# Patient Record
Sex: Male | Born: 1985
Health system: Southern US, Community
[De-identification: ages and names within clinical notes are randomized; demographics above are authoritative.]

## PROBLEM LIST (undated history)

## (undated) DIAGNOSIS — E119 Type 2 diabetes mellitus without complications: Secondary | ICD-10-CM

## (undated) DIAGNOSIS — I10 Essential (primary) hypertension: Secondary | ICD-10-CM

## (undated) HISTORY — DX: Type 2 diabetes mellitus without complications: E11.9

## (undated) HISTORY — DX: Essential (primary) hypertension: I10

---

## 2015-01-25 ENCOUNTER — Ambulatory Visit (INDEPENDENT_AMBULATORY_CARE_PROVIDER_SITE_OTHER): Payer: 59 | Admitting: Family Medicine

## 2015-01-25 VITALS — BP 158/90 | HR 75 | Temp 98.5°F | Resp 16 | Ht 74.5 in | Wt 278.2 lb

## 2015-01-25 DIAGNOSIS — E119 Type 2 diabetes mellitus without complications: Secondary | ICD-10-CM | POA: Diagnosis not present

## 2015-01-25 DIAGNOSIS — I10 Essential (primary) hypertension: Secondary | ICD-10-CM

## 2015-01-25 DIAGNOSIS — Z2821 Immunization not carried out because of patient refusal: Secondary | ICD-10-CM | POA: Diagnosis not present

## 2015-01-25 LAB — LIPID PANEL
Cholesterol: 208 mg/dL — ABNORMAL HIGH (ref 125–200)
HDL: 34 mg/dL — ABNORMAL LOW (ref >=40)
LDL Cholesterol: 151 mg/dL — ABNORMAL HIGH (ref <130)
Total CHOL/HDL Ratio: 6.1 ratio — ABNORMAL HIGH (ref <=5.0)
Triglycerides: 113 mg/dL (ref <150)
VLDL: 23 mg/dL (ref <30)

## 2015-01-25 LAB — COMPLETE METABOLIC PANEL WITHOUT GFR
ALT: 14 U/L (ref 9–46)
AST: 14 U/L (ref 10–40)
BUN: 9 mg/dL (ref 7–25)
Chloride: 99 mmol/L (ref 98–110)
Creat: 0.82 mg/dL (ref 0.60–1.35)
Glucose, Bld: 175 mg/dL — ABNORMAL HIGH (ref 65–99)
Potassium: 4.4 mmol/L (ref 3.5–5.3)
Sodium: 137 mmol/L (ref 135–146)

## 2015-01-25 LAB — COMPLETE METABOLIC PANEL WITH GFR
Albumin: 4.9 g/dL (ref 3.6–5.1)
Alkaline Phosphatase: 56 U/L (ref 40–115)
CO2: 27 mmol/L (ref 20–31)
Calcium: 9.6 mg/dL (ref 8.6–10.3)
GFR, Est African American: >89 mL/min (ref >=60)
GFR, Est Non African American: >89 mL/min (ref >=60)
Total Bilirubin: 0.5 mg/dL (ref 0.2–1.2)
Total Protein: 8 g/dL (ref 6.1–8.1)

## 2015-01-25 LAB — MICROALBUMIN, URINE: Microalb, Ur: 1.2 mg/dL (ref <2.0)

## 2015-01-25 LAB — TSH: TSH: 0.558 u[IU]/mL (ref 0.350–4.500)

## 2015-01-25 LAB — POCT GLYCOSYLATED HEMOGLOBIN (HGB A1C): Hemoglobin A1C: 7.3

## 2015-01-25 MED ORDER — METFORMIN HCL ER 500 MG PO TB24: ORAL_TABLET | ORAL | Status: DC

## 2015-01-25 MED ORDER — LISINOPRIL 20 MG PO TABS: 20.0000 mg | ORAL_TABLET | Freq: Every day | ORAL | Status: DC

## 2015-01-25 NOTE — Progress Notes (Signed)
Chief Complaint:  Chief Complaint  Patient presents with  . Medication Refill    all    HPI: Drew Hernandez is a 29 y.o. male who reports to [facility] today complaining of   1. HTN-was on lisinopril then was changed to atenolol, did not have any SEs. No cough, etc  2. Diabetes-has been on metformin and also glipizide, has ahd some hypoglycemia, doe snot measure but knows his sxs Denies neuropathy, CP, SOB, hypoglycemia, palpitations, presyncope/syncope, polydipsia, polyuria, nausea, vomiting, abd pain He does have carpal tunnel like sxs since started working for [employer] Moved here from [place] to  to be closer to mom who is here by herself    Past Medical History  Diagnosis Date  . Diabetes mellitus without complication     dx with DM 2 age 37, he was on insulin in the past and lost weight, was over 400 lbs.   . Hypertension     age 16 due to obesity   History reviewed. No pertinent past surgical history. Social History   Social History  . Marital Status: Single    Spouse Name: N/A  . Number of Children: N/A  . Years of Education: N/A   Social History Main Topics  . Smoking status: Never Smoker   . Smokeless tobacco: None  . Alcohol Use: None  . Drug Use: None  . Sexual Activity: Not Asked   Other Topics Concern  . None   Social History Narrative  . None   Family History  Problem Relation Age of Onset  . Cancer Paternal Grandfather   . Hypertension Paternal Grandfather    No Known Allergies Prior to Admission medications   Medication Sig Start Date End Date Taking? Authorizing Provider  atenolol (TENORMIN) 25 MG tablet Take by mouth daily.   Yes Historical Provider, MD  glipiZIDE (GLUCOTROL) 5 MG tablet Take by mouth 3 (three) times daily with meals.   Yes Historical Provider, MD  metFORMIN (GLUCOPHAGE) 1000 MG tablet Take 1,000 mg by mouth 2 (two) times daily with a meal.   Yes Historical Provider, MD     ROS: The patient denies fevers, chills, night  sweats, unintentional weight loss, chest pain, palpitations, wheezing, dyspnea on exertion, nausea, vomiting, abdominal pain, dysuria, hematuria, melena, numbness, weakness, or tingling.   All other systems have been reviewed and were otherwise negative with the exception of those mentioned in the HPI and as above.    PHYSICAL EXAM: Filed Vitals:   01/25/15 1027  BP: 158/90  Pulse: 75  Temp: 98.5 F (36.9 C)  Resp: 16   Body mass index is 35.25 kg/(m^2).   General: Alert, no acute distress HEENT:  Normocephalic, atraumatic, oropharynx patent. EOMI, PERRLA Cardiovascular:  Regular rate and rhythm, no rubs murmurs or gallops.  No Carotid bruits, radial pulse intact. No pedal edema.  Respiratory: Clear to auscultation bilaterally.  No wheezes, rales, or rhonchi.  No cyanosis, no use of accessory musculature Abdominal: No organomegaly, abdomen is soft and non-tender, positive bowel sounds. No masses. Skin: No rashes. Neurologic: Facial musculature symmetric. Psychiatric: Patient acts appropriately throughout our interaction. Lymphatic: No cervical or submandibular lymphadenopathy Musculoskeletal: Gait intact. No edema, tenderness   LABS: Results for orders placed or performed in visit on 01/25/15  POCT glycosylated hemoglobin (Hb A1C)  Result Value Ref Range   Hemoglobin A1C 7.3      EKG/XRAY:   Primary read interpreted by Dr. Conley Rolls at [facility]   ASSESSMENT/PLAN: Encounter Diagnoses  Name Primary?  . Essential hypertension Yes  . Type 2 diabetes mellitus without complication   . Influenza vaccination declined    rx lisinopril 20 mg daily  Dicontinue atenolol if CMP and GFR is normal Change metrofmin 1000 mg bid to metofrmin 500 mg ER TID and tolerate the change to 500 mg in AM, lunch and also 1000 mg in PM Stop glipizide FU in 1 months Recommend : ADA diet, BP goal <140/90, daily foot exams, tobacco cessation if smoking, annual eye exam, annual flu vaccine, PNA vaccine if  age and time appropriate.   Gross sideeffects, risk and benefits, and alternatives of medications d/w patient. Patient is aware that all medications have potential sideeffects and we are unable to predict every sideeffect or drug-drug interaction that may occur.  Carla Rashad DO  01/25/2015 12:00 PM  01/26/15-LM about labs and he can start on the new regiment of meds since kidney fucntion ok. He will fu in 1 month

## 2015-01-25 NOTE — Patient Instructions (Addendum)
Diabetes and Standards of Medical Care Diabetes is complicated. You may find that your diabetes team includes a dietitian, nurse, diabetes educator, eye doctor, and more. To help everyone know what is going on and to help you get the care you deserve, the following schedule of care was developed to help keep you on track. Below are the tests, exams, vaccines, medicines, education, and plans you will need. HbA1c test This test shows how well you have controlled your glucose over the past 2-3 months. It is used to see if your diabetes management plan needs to be adjusted.   It is performed at least 2 times a year if you are meeting treatment goals.  It is performed 4 times a year if therapy has changed or if you are not meeting treatment goals. Blood pressure test  This test is performed at every routine medical visit. The goal is less than 140/90 mm Hg for most people, but 130/80 mm Hg in some cases. Ask your health care provider about your goal. Dental exam  Follow up with the dentist regularly. Eye exam  If you are diagnosed with type 1 diabetes as a child, get an exam upon reaching the age of 82 years or older and have had diabetes for 3-5 years. Yearly eye exams are recommended after that initial eye exam.  If you are diagnosed with type 1 diabetes as an adult, get an exam within 5 years of diagnosis and then yearly.  If you are diagnosed with type 2 diabetes, get an exam as soon as possible after the diagnosis and then yearly. Foot care exam  Visual foot exams are performed at every routine medical visit. The exams check for cuts, injuries, or other problems with the feet.  A comprehensive foot exam should be done yearly. This includes visual inspection as well as assessing foot pulses and testing for loss of sensation.  Check your feet nightly for cuts, injuries, or other problems with your feet. Tell your health care provider if anything is not healing. Kidney function test (urine  microalbumin)  This test is performed once a year.  Type 1 diabetes: The first test is performed 5 years after diagnosis.  Type 2 diabetes: The first test is performed at the time of diagnosis.  A serum creatinine and estimated glomerular filtration rate (eGFR) test is done once a year to assess the level of chronic kidney disease (CKD), if present. Lipid profile (cholesterol, HDL, LDL, triglycerides)  Performed every 5 years for most people.  The goal for LDL is less than 100 mg/dL. If you are at high risk, the goal is less than 70 mg/dL.  The goal for HDL is 40 mg/dL-50 mg/dL for men and 50 mg/dL-60 mg/dL for women. An HDL cholesterol of 60 mg/dL or higher gives some protection against heart disease.  The goal for triglycerides is less than 150 mg/dL. Influenza vaccine, pneumococcal vaccine, and hepatitis B vaccine  The influenza vaccine is recommended yearly.  It is recommended that people with diabetes who are over 30 years old get the pneumonia vaccine. In some cases, two separate shots may be given. Ask your health care provider if your pneumonia vaccination is up to date.  The hepatitis B vaccine is also recommended for adults with diabetes. Diabetes self-management education  Education is recommended at diagnosis and ongoing as needed. Treatment plan  Your treatment plan is reviewed at every medical visit. Document Released: 03/15/2009 Document Revised: 10/02/2013 Document Reviewed: 10/18/2012 [facility] Patient Information 2015 [place]. This information is not intended to replace advice given to you by your health care provider. Make sure you discuss any questions you have with your health care provider. Hypertension Hypertension is another name for high blood pressure. High blood pressure forces your heart to work harder to pump blood. A blood pressure reading has two numbers, which includes a higher number over a lower number (example: 110/72). HOME CARE   Have  your blood pressure rechecked by your doctor.  Only take medicine as told by your doctor. Follow the directions carefully. The medicine does not work as well if you skip doses. Skipping doses also puts you at risk for problems.  Do not smoke.  Monitor your blood pressure at home as told by your doctor. GET HELP IF:  You think you are having a reaction to the medicine you are taking.  You have repeat headaches or feel dizzy.  You have puffiness (swelling) in your ankles.  You have trouble with your vision. GET HELP RIGHT AWAY IF:   You get a very bad headache and are confused.  You feel weak, numb, or faint.  You get chest or belly (abdominal) pain.  You throw up (vomit).  You cannot breathe very well. MAKE SURE YOU:   Understand these instructions.  Will watch your condition.  Will get help right away if you are not doing well or get worse. Document Released: 11/04/2007 Document Revised: 05/23/2013 Document Reviewed: 03/10/2013 [employer] Patient Information 2015 [employer]. This information is not intended to replace advice given to you by your health care provider. Make sure you discuss any questions you have with your health care provider.  

## 2015-01-26 DIAGNOSIS — E119 Type 2 diabetes mellitus without complications: Secondary | ICD-10-CM | POA: Insufficient documentation

## 2015-01-26 DIAGNOSIS — I10 Essential (primary) hypertension: Secondary | ICD-10-CM | POA: Insufficient documentation

## 2015-02-01 ENCOUNTER — Encounter: Payer: Self-pay | Admitting: Family Medicine

## 2015-03-08 ENCOUNTER — Ambulatory Visit (INDEPENDENT_AMBULATORY_CARE_PROVIDER_SITE_OTHER): Payer: 59 | Admitting: Emergency Medicine

## 2015-03-08 VITALS — BP 132/84 | HR 70 | Temp 98.4°F | Resp 18 | Ht 74.5 in | Wt 274.0 lb

## 2015-03-08 DIAGNOSIS — I10 Essential (primary) hypertension: Secondary | ICD-10-CM

## 2015-03-08 DIAGNOSIS — E119 Type 2 diabetes mellitus without complications: Secondary | ICD-10-CM | POA: Diagnosis not present

## 2015-03-08 DIAGNOSIS — N521 Erectile dysfunction due to diseases classified elsewhere: Secondary | ICD-10-CM | POA: Diagnosis not present

## 2015-03-08 LAB — LIPID PANEL
Cholesterol: 199 mg/dL (ref 125–200)
HDL: 34 mg/dL — ABNORMAL LOW (ref >=40)
LDL CALC: 153 mg/dL — AB (ref <130)
TRIGLYCERIDES: 59 mg/dL (ref <150)
Total CHOL/HDL Ratio: 5.9 Ratio — ABNORMAL HIGH (ref <=5.0)
VLDL: 12 mg/dL (ref <30)

## 2015-03-08 LAB — COMPREHENSIVE METABOLIC PANEL
ALT: 13 U/L (ref 9–46)
AST: 15 U/L (ref 10–40)
Albumin: 4.7 g/dL (ref 3.6–5.1)
Alkaline Phosphatase: 50 U/L (ref 40–115)
BUN: 10 mg/dL (ref 7–25)
CHLORIDE: 101 mmol/L (ref 98–110)
CO2: 27 mmol/L (ref 20–31)
CREATININE: 0.75 mg/dL (ref 0.60–1.35)
Calcium: 9.6 mg/dL (ref 8.6–10.3)
GLUCOSE: 124 mg/dL — AB (ref 65–99)
Potassium: 4.8 mmol/L (ref 3.5–5.3)
SODIUM: 137 mmol/L (ref 135–146)
Total Bilirubin: 0.8 mg/dL (ref 0.2–1.2)
Total Protein: 7.7 g/dL (ref 6.1–8.1)

## 2015-03-08 LAB — HEMOGLOBIN A1C: Hgb A1c MFr Bld: 7.2 % — AB (ref 4.0–6.0)

## 2015-03-08 LAB — POCT GLYCOSYLATED HEMOGLOBIN (HGB A1C): HEMOGLOBIN A1C: 7.2

## 2015-03-08 LAB — GLUCOSE, POCT (MANUAL RESULT ENTRY): POC GLUCOSE: 126 mg/dL — AB (ref 70–99)

## 2015-03-08 MED ORDER — METFORMIN HCL ER 500 MG PO TB24: ORAL_TABLET | ORAL | Status: DC

## 2015-03-08 MED ORDER — SILDENAFIL CITRATE 100 MG PO TABS: 100.0000 mg | ORAL_TABLET | Freq: Every day | ORAL | Status: DC | PRN

## 2015-03-08 MED ORDER — PRAVASTATIN SODIUM 40 MG PO TABS: 40.0000 mg | ORAL_TABLET | Freq: Every day | ORAL | Status: DC

## 2015-03-08 MED ORDER — LISINOPRIL 20 MG PO TABS: 20.0000 mg | ORAL_TABLET | Freq: Every day | ORAL | Status: DC

## 2015-03-08 NOTE — Patient Instructions (Signed)

## 2015-03-08 NOTE — Progress Notes (Signed)
Subjective:  Patient ID: Drew Hernandez, male    DOB: 02/16/1986  Age: 29 y.o. MRN: [id number]  CC: Follow-up   HPI Drew Hernandez presents  for repeat of his lab work. His blood pressures come down nicely with the lisinopril he has no adverse effect of the lisinopril or the metformin. He doesn't check his sugar daily. Is concerned about taking the metformin 1 dose because of concerns about diarrhea.  History Drew Hernandez has a past medical history of Diabetes mellitus without complication (HCC) and Hypertension.   He has no past surgical history on file.   His  family history includes Cancer in his paternal grandfather; Hypertension in his paternal grandfather.  He   reports that he has never smoked. He does not have any smokeless tobacco history on file. His alcohol and drug histories are not on file.  Outpatient Prescriptions Prior to Visit  Medication Sig Dispense Refill  . lisinopril (PRINIVIL,ZESTRIL) 20 MG tablet Take 1 tablet (20 mg total) by mouth daily. 30 tablet 3  . metFORMIN (GLUCOPHAGE XR) 500 MG 24 hr tablet Take 1 tab PO TID, then if tolerate then take 1 tab with breakfast, 1 tab at lunch, and 2 tabs before bedtime 120 tablet 3   No facility-administered medications prior to visit.    Social History   Social History  . Marital Status: Single    Spouse Name: N/A  . Number of Children: N/A  . Years of Education: N/A   Social History Main Topics  . Smoking status: Never Smoker   . Smokeless tobacco: None  . Alcohol Use: None  . Drug Use: None  . Sexual Activity: Not Asked   Other Topics Concern  . None   Social History Narrative     Review of Systems  Objective:  BP 132/84 mmHg  Pulse 70  Temp(Src) 98.4 F (36.9 C) (Oral)  Resp 18  Ht 6' 2.5" (1.892 m)  Wt 274 lb (124.286 kg)  BMI 34.72 kg/m2  SpO2 98%  Physical Exam    Assessment & Plan:   Drew Hernandez was seen today for follow-up.  Diagnoses and all orders for this visit:  Diabetes mellitus  without complication (HCC) -     POCT glycosylated hemoglobin (Hb A1C) -     POCT glucose (manual entry) -     Comprehensive metabolic panel -     Lipid panel  Erectile dysfunction due to diseases classified elsewhere -     POCT glycosylated hemoglobin (Hb A1C) -     POCT glucose (manual entry) -     Comprehensive metabolic panel -     Lipid panel  Essential hypertension -     metFORMIN (GLUCOPHAGE XR) 500 MG 24 hr tablet; Take 1 tab PO TID, then if tolerate then take 1 tab with breakfast, 1 tab at lunch, and 2 tabs before bedtime -     lisinopril (PRINIVIL,ZESTRIL) 20 MG tablet; Take 1 tablet (20 mg total) by mouth daily.  Type 2 diabetes mellitus without complication, without long-term current use of insulin (HCC) -     metFORMIN (GLUCOPHAGE XR) 500 MG 24 hr tablet; Take 1 tab PO TID, then if tolerate then take 1 tab with breakfast, 1 tab at lunch, and 2 tabs before bedtime -     lisinopril (PRINIVIL,ZESTRIL) 20 MG tablet; Take 1 tablet (20 mg total) by mouth daily.  Other orders -     sildenafil (VIAGRA) 100 MG tablet; Take 1 tablet (100 mg total) by mouth  daily as needed for erectile dysfunction. -     pravastatin (PRAVACHOL) 40 MG tablet; Take 1 tablet (40 mg total) by mouth daily.   I am having Mr. Kataoka start on sildenafil and pravastatin. I am also having him maintain his metFORMIN and lisinopril.  Meds ordered this encounter  Medications  . sildenafil (VIAGRA) 100 MG tablet    Sig: Take 1 tablet (100 mg total) by mouth daily as needed for erectile dysfunction.    Dispense:  10 tablet    Refill:  11  . pravastatin (PRAVACHOL) 40 MG tablet    Sig: Take 1 tablet (40 mg total) by mouth daily.    Dispense:  90 tablet    Refill:  3  . metFORMIN (GLUCOPHAGE XR) 500 MG 24 hr tablet    Sig: Take 1 tab PO TID, then if tolerate then take 1 tab with breakfast, 1 tab at lunch, and 2 tabs before bedtime    Dispense:  120 tablet    Refill:  5  . lisinopril (PRINIVIL,ZESTRIL) 20  MG tablet    Sig: Take 1 tablet (20 mg total) by mouth daily.    Dispense:  30 tablet    Refill:  5   He was instructed to increase his metformin xr from one in the morning and 1 in the evening to 2 twice a day since he is concerned about diarrhea from taking them altogether and 1 dose. He'll follow-up as directed  Appropriate red flag conditions were discussed with the patient as well as actions that should be taken.  Patient expressed his understanding.  Follow-up: Return in about 6 months (around 09/06/2015).  Carmelina Dane, MD   . Results for orders placed or performed in visit on 03/08/15  POCT glycosylated hemoglobin (Hb A1C)  Result Value Ref Range   Hemoglobin A1C 7.2   POCT glucose (manual entry)  Result Value Ref Range   POC Glucose 126 (A) 70 - 99 mg/dl

## 2015-03-14 ENCOUNTER — Ambulatory Visit

## 2015-03-14 ENCOUNTER — Ambulatory Visit (INDEPENDENT_AMBULATORY_CARE_PROVIDER_SITE_OTHER): Admitting: Urgent Care

## 2015-03-14 VITALS — BP 130/84 | HR 77 | Temp 97.8°F | Resp 16 | Ht 73.75 in | Wt 272.0 lb

## 2015-03-14 DIAGNOSIS — M62830 Muscle spasm of back: Secondary | ICD-10-CM | POA: Diagnosis not present

## 2015-03-14 DIAGNOSIS — M5489 Other dorsalgia: Secondary | ICD-10-CM | POA: Diagnosis not present

## 2015-03-14 LAB — POCT URINALYSIS DIP (MANUAL ENTRY)
Bilirubin, UA: NEGATIVE
Blood, UA: NEGATIVE
Glucose, UA: NEGATIVE
LEUKOCYTES UA: NEGATIVE
NITRITE UA: NEGATIVE
PROTEIN UA: NEGATIVE
UROBILINOGEN UA: 1
pH, UA: 5.5

## 2015-03-14 MED ORDER — CYCLOBENZAPRINE HCL 10 MG PO TABS: 10.0000 mg | ORAL_TABLET | Freq: Three times a day (TID) | ORAL | Status: DC | PRN

## 2015-03-14 MED ORDER — MELOXICAM 15 MG PO TABS: 7.5000 mg | ORAL_TABLET | Freq: Every day | ORAL | Status: DC

## 2015-03-14 NOTE — Progress Notes (Addendum)
MRN: [id number] DOB: 1985/07/21  Subjective:   Drew Hernandez is a 29 y.o. male [employer] employee presenting for chief complaint of Back Pain  Reports ~1 month history of worsening back pain along his midline, radiates out laterally along his flank, worse with movement or sitting long periods of time. Pain has been dull, aching but yesterday progressed to sharp severe back pain. 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 His supervisor told him that he needed to come into clinic today. [facility] heating and cold packs that have not provided any relief. Denies fever, saddle paresthesia, trauma, weakness, falls, incontinence. 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 He also lifts boxes of varying weights from light to very heavy. He has had increasing difficulty completing all his tasks at work due to his back pain. Denies any other aggravating or relieving factors, no other questions or concerns.  Yamato's medications list, allergies, past medical history and past surgical history were reviewed and excluded from this note due to being a worker's comp case.  Objective:   Vitals: BP 130/84 mmHg  Pulse 77  Temp(Src) 97.8 F (36.6 C) (Oral)  Resp 16  Ht 6' 1.75" (1.873 m)  Wt 272 lb (123.378 kg)  BMI 35.17 kg/m2  SpO2 98%  Physical Exam  Constitutional: He is oriented to person, place, and time. He appears well-developed and well-nourished.  Cardiovascular: Normal rate.   Pulmonary/Chest: Effort normal.  Abdominal: Soft. Bowel sounds are normal. He exhibits no distension and no mass. There is no tenderness.  Musculoskeletal:       Thoracic back: He exhibits decreased range of motion (flexion and extension), tenderness (as depicted, worse on right) and spasm. He exhibits no swelling, no edema, no deformity, no laceration and no pain.       Lumbar back: He exhibits  decreased range of motion (flexion and extension). He exhibits no tenderness, no bony tenderness, no swelling, no deformity and no spasm.       Back:  Neurological: He is alert and oriented to person, place, and time.  Skin: Skin is warm and dry. No rash noted. No erythema. No pallor.   [facility] reading (PRIMARY) by  Dr. Milus GlazierLauenstein and PA-Lynton Crescenzo. Thoracic - mild scoliosis consistent with back spasms, no acute process. Lumbar - normal.  Dg Thoracic Spine 2 View  03/14/2015  CLINICAL DATA:  Pain. EXAM: THORACIC SPINE 2 VIEWS COMPARISON:  None. FINDINGS: Thoraco lumbar scoliosis. No acute bony abnormality. Normal mineralization. IMPRESSION: Thoracolumbar scoliosis.  No acute abnormality. Electronically Signed   By: Maisie Fushomas  Register   On: 03/14/2015 11:10   Dg Lumbar Spine Complete  03/14/2015  CLINICAL DATA:  Mid back pain EXAM: LUMBAR SPINE - COMPLETE 4+ VIEW COMPARISON:  None. FINDINGS: There is no evidence of lumbar spine fracture. Alignment is normal. Intervertebral disc spaces are maintained. IMPRESSION: No acute osseous injury of the lumbar spine. Electronically Signed   By: Elige KoHetal  Patel   On: 03/14/2015 11:10     Results for orders placed or performed in visit on 03/14/15 (from the past 24 hour(s))  POCT urinalysis dipstick     Status: Abnormal   Collection Time: 03/14/15 10:09 AM  Result Value Ref Range   Color, UA yellow yellow   Clarity, UA clear clear   Glucose, UA negative negative   Bilirubin, UA negative negative   Ketones, POC UA trace (5) (A)  negative   Spec Grav, UA >=1.030    Blood, UA negative negative   pH, UA 5.5    Protein Ur, POC negative negative   Urobilinogen, UA 1.0    Nitrite, UA Negative Negative   Leukocytes, UA Negative Negative   Assessment and Plan :   1. Spasm of back muscles 2. Midline back pain, unspecified location - Likely work-related due to repetitive motions, lifting variable weights. Advised light duty for 1 week, meloxicam for pain and  inflammation, Flexeril for muscle spasms. Work restrictions provided.  Wallis Bamberg, PA-C [facility] [phone] 03/14/2015 9:22 AM  History, physical, and x-ray reviewed. Assessment and plan seem reasonable.  Signed, Marshell Levan

## 2015-03-14 NOTE — Patient Instructions (Signed)

## 2015-03-21 ENCOUNTER — Ambulatory Visit (INDEPENDENT_AMBULATORY_CARE_PROVIDER_SITE_OTHER): Admitting: Physician Assistant

## 2015-03-21 VITALS — BP 128/80 | HR 87 | Temp 98.5°F | Resp 16 | Ht 75.0 in | Wt 272.0 lb

## 2015-03-21 DIAGNOSIS — M5489 Other dorsalgia: Secondary | ICD-10-CM | POA: Diagnosis not present

## 2015-03-21 DIAGNOSIS — M62830 Muscle spasm of back: Secondary | ICD-10-CM | POA: Diagnosis not present

## 2015-03-21 NOTE — Progress Notes (Addendum)
Drew Hernandez March 04, 1986 29 y.o.   Chief Complaint  Patient presents with  . Follow-up    back pain, x 1 week      Date of Injury: ~1 month and 7 days ago from the date of this note. Marland Kitchen.   History of Present Illness:  Presents for evaluation of work-related complaint. He is here for follow up of [facility]  Than HPI is as follows:   "Drew Hernandez is a 29 y.o. male [employer] employee presenting for chief complaint of Back Pain:  Reports ~1 month history of worsening back pain along his midline, radiates out laterally along his flank, worse with movement or sitting long periods of time. Pain has been dull, aching but yesterday progressed to sharp severe back pain. 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 His supervisor told him that he needed to come into clinic today. [facility] and cold packs that have not provided any relief. Denies fever, saddle paresthesia, trauma, weakness, falls, incontinence. 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 He also lifts boxes of varying weights from light to very heavy. He has had increasing difficulty completing all his tasks at work due to his back pain. Denies any other aggravating or relieving factors, no other questions or concerns."  Today he continues to complain of pain however reports that he feels 50% better.  He was asked to return to work only when he is full duty.  He is compliant with Meloxicam daily and Flexeril recommendations.  He feels that he needs more time. Denies incontinence, weakness and paresthesia.      Review of Systems  Constitutional: Negative for fever.  Respiratory: Negative for cough.   Cardiovascular: Negative for chest pain.  Genitourinary: Negative for dysuria, urgency and frequency.  Musculoskeletal: Positive for myalgias and back pain. Negative for joint  pain, falls and neck pain.  Skin: Negative for rash.  Neurological: Negative for dizziness and headaches.      No Known Allergies   Current medications reviewed and updated. Past medical history, family history, social history have been reviewed and updated.   Physical Exam  Constitutional: He is oriented to person, place, and time.  Cardiovascular: Normal rate.   Pulmonary/Chest: Effort normal.  Abdominal: He exhibits no distension.  Neurological: He is alert and oriented to person, place, and time. He has normal motor skills, normal sensation, normal strength, normal reflexes and intact cranial nerves. He displays no weakness. Gait normal.  Reflex Scores:      Patellar reflexes are 2+ on the right side and 2+ on the left side.      Achilles reflexes are 2+ on the right side and 2+ on the left side. Skin: Skin is warm and dry.  Psychiatric: Affect normal.  Vitals reviewed.   Filed Vitals:   03/21/15 1139  BP: 128/80  Pulse: 87  Temp: 98.5 F (36.9 C)  Resp: 16     Drew Hernandez was seen today for follow-up.  Diagnoses and all orders for this visit:  Spasm of back muscles: Patient improving.  No alarm features.  He is to RTC in five days, 03/26/15 for potential clearance without restrictions.   Midline back pain, unspecified location    Deliah BostonMichael Clark, MS, PA-C   12:28 PM, 03/25/2015

## 2015-03-26 ENCOUNTER — Ambulatory Visit (INDEPENDENT_AMBULATORY_CARE_PROVIDER_SITE_OTHER): Admitting: Internal Medicine

## 2015-03-26 VITALS — BP 124/82 | HR 83 | Temp 98.4°F | Resp 16 | Ht 75.0 in | Wt 275.0 lb

## 2015-03-26 DIAGNOSIS — S39012D Strain of muscle, fascia and tendon of lower back, subsequent encounter: Secondary | ICD-10-CM | POA: Diagnosis not present

## 2015-03-26 NOTE — Progress Notes (Signed)
Patient ID: Drew Hernandez, male   DOB: 07/04/1985, 29 y.o.   MRN: [id number]   03/26/2015 at 12:30 PM  Drew Hernandez / DOB: 07/04/1985 / MRN: [id number]  Problem list reviewed and updated by me where necessary.   SUBJECTIVE  Drew Hernandez is a 29 y.o. well appearing male presenting for the chief complaint of need return to work note. Back injury resolved and has full recovery..     He  has a past medical history of Diabetes mellitus without complication (HCC) and Hypertension.    Medications reviewed and updated by myself where necessary, and exist elsewhere in the encounter.   Drew Hernandez has No Known Allergies. He  reports that he has never smoked. He has never used smokeless tobacco. He reports that he drinks alcohol. He reports that he does not use illicit drugs. He  has no sexual activity history on file. The patient  has no past surgical history on file.  His family history includes Cancer in his paternal grandfather; Hypertension in his paternal grandfather.  Review of Systems  Constitutional: Negative for fever.  Respiratory: Negative for shortness of breath.   Cardiovascular: Negative for chest pain.  Gastrointestinal: Negative for nausea.  Musculoskeletal: Negative.   Skin: Negative for rash.  Neurological: Negative for dizziness and headaches.    OBJECTIVE  His  height is 6\' 3"  (1.905 m) and weight is 275 lb (124.739 kg). His oral temperature is 98.4 F (36.9 C). His blood pressure is 124/82 and his pulse is 83. His respiration is 16 and oxygen saturation is 99%.  The patient's body mass index is 34.37 kg/(m^2).  Physical Exam  Constitutional: He is oriented to person, place, and time. He appears well-developed and well-nourished. No distress.  HENT:  Head: Normocephalic.  Nose: Nose normal.  Eyes: Conjunctivae and EOM are normal.  Respiratory: Effort normal.  Musculoskeletal:       Lumbar back: Normal. He exhibits normal range of motion, no tenderness, no bony  tenderness, no swelling, no edema, no deformity, no laceration, no pain, no spasm and normal pulse.  Neurological: He is alert and oriented to person, place, and time. No cranial nerve deficit. He exhibits normal muscle tone. Coordination normal.  Psychiatric: He has a normal mood and affect.    No results found for this or any previous visit (from the past 24 hour(s)).  ASSESSMENT & PLAN  There are no diagnoses linked to this encounter.

## 2015-04-01 ENCOUNTER — Encounter: Payer: Self-pay | Admitting: Family Medicine

## 2015-04-05 ENCOUNTER — Other Ambulatory Visit: Payer: Self-pay

## 2015-04-05 DIAGNOSIS — I10 Essential (primary) hypertension: Secondary | ICD-10-CM

## 2015-04-05 DIAGNOSIS — E119 Type 2 diabetes mellitus without complications: Secondary | ICD-10-CM

## 2015-04-05 MED ORDER — PRAVASTATIN SODIUM 40 MG PO TABS: 40.0000 mg | ORAL_TABLET | Freq: Every day | ORAL | Status: DC

## 2015-04-05 MED ORDER — METFORMIN HCL ER 500 MG PO TB24: ORAL_TABLET | ORAL | Status: DC

## 2015-04-05 MED ORDER — LISINOPRIL 20 MG PO TABS
20.0000 mg | ORAL_TABLET | Freq: Every day | ORAL | Status: DC
Start: 2015-04-05 — End: 2015-04-09

## 2015-04-06 ENCOUNTER — Telehealth: Payer: Self-pay | Admitting: *Deleted

## 2015-04-06 NOTE — Telephone Encounter (Signed)
Need to know if medications are to go to [facility] or [facility].  We received a fax from [facility] stating they need clarification on three rx's metfomin, pravastatin, and lisinopril.  Not sure what [facility] need, rx's look fine.  [facility]  Lmom to see where he wants these filled at.  The only thing I could think of is [facility] may not have him the system

## 2015-04-09 ENCOUNTER — Telehealth: Payer: Self-pay

## 2015-04-09 ENCOUNTER — Other Ambulatory Visit: Payer: Self-pay | Admitting: *Deleted

## 2015-04-09 DIAGNOSIS — E119 Type 2 diabetes mellitus without complications: Secondary | ICD-10-CM

## 2015-04-09 DIAGNOSIS — I10 Essential (primary) hypertension: Secondary | ICD-10-CM

## 2015-04-09 MED ORDER — METFORMIN HCL ER 500 MG PO TB24: ORAL_TABLET | ORAL | Status: DC

## 2015-04-09 MED ORDER — PRAVASTATIN SODIUM 40 MG PO TABS: 40.0000 mg | ORAL_TABLET | Freq: Every day | ORAL | Status: DC

## 2015-04-09 MED ORDER — LISINOPRIL 20 MG PO TABS: 20.0000 mg | ORAL_TABLET | Freq: Every day | ORAL | Status: DC

## 2015-04-23 ENCOUNTER — Telehealth: Payer: Self-pay

## 2015-04-23 DIAGNOSIS — I10 Essential (primary) hypertension: Secondary | ICD-10-CM

## 2015-04-23 DIAGNOSIS — E119 Type 2 diabetes mellitus without complications: Secondary | ICD-10-CM

## 2015-04-23 MED ORDER — PRAVASTATIN SODIUM 40 MG PO TABS: 40.0000 mg | ORAL_TABLET | Freq: Every day | ORAL | Status: DC

## 2015-04-23 MED ORDER — LISINOPRIL 20 MG PO TABS: 20.0000 mg | ORAL_TABLET | Freq: Every day | ORAL | Status: DC

## 2015-04-23 MED ORDER — METFORMIN HCL ER 500 MG PO TB24: ORAL_TABLET | ORAL | Status: DC

## 2015-04-23 NOTE — Telephone Encounter (Signed)
Pt called and reported that [facility] is the correct mail order, but they say they did not receive the Rxs we sent on 11/8. Advised pt I will re-send. Done.

## 2015-05-17 ENCOUNTER — Ambulatory Visit (INDEPENDENT_AMBULATORY_CARE_PROVIDER_SITE_OTHER): Payer: 59 | Admitting: Family Medicine

## 2015-05-17 VITALS — BP 132/80 | HR 71 | Temp 99.0°F | Resp 18 | Ht 75.0 in | Wt 268.0 lb

## 2015-05-17 DIAGNOSIS — A09 Infectious gastroenteritis and colitis, unspecified: Secondary | ICD-10-CM | POA: Diagnosis not present

## 2015-05-17 DIAGNOSIS — R110 Nausea: Secondary | ICD-10-CM

## 2015-05-17 DIAGNOSIS — E118 Type 2 diabetes mellitus with unspecified complications: Secondary | ICD-10-CM | POA: Diagnosis not present

## 2015-05-17 DIAGNOSIS — R197 Diarrhea, unspecified: Secondary | ICD-10-CM

## 2015-05-17 LAB — COMPREHENSIVE METABOLIC PANEL
ALBUMIN: 4.4 g/dL (ref 3.6–5.1)
ALT: 15 U/L (ref 9–46)
AST: 16 U/L (ref 10–40)
Alkaline Phosphatase: 53 U/L (ref 40–115)
BILIRUBIN TOTAL: 0.5 mg/dL (ref 0.2–1.2)
BUN: 12 mg/dL (ref 7–25)
CALCIUM: 9.5 mg/dL (ref 8.6–10.3)
CHLORIDE: 102 mmol/L (ref 98–110)
CO2: 28 mmol/L (ref 20–31)
CREATININE: 0.78 mg/dL (ref 0.60–1.35)
Glucose, Bld: 126 mg/dL — ABNORMAL HIGH (ref 65–99)
Potassium: 5.1 mmol/L (ref 3.5–5.3)
SODIUM: 137 mmol/L (ref 135–146)
TOTAL PROTEIN: 7.7 g/dL (ref 6.1–8.1)

## 2015-05-17 LAB — POCT CBC
GRANULOCYTE PERCENT: 72.9 % (ref 37–80)
HCT, POC: 41.3 % — AB (ref 43.5–53.7)
Hemoglobin: 13.8 g/dL — AB (ref 14.1–18.1)
Lymph, poc: 1.6 (ref 0.6–3.4)
MCH: 27.5 pg (ref 27–31.2)
MCHC: 33.4 g/dL (ref 31.8–35.4)
MCV: 82.4 fL (ref 80–97)
MID (CBC): 0.4 (ref 0–0.9)
MPV: 7.5 fL (ref 0–99.8)
PLATELET COUNT, POC: 193 10*3/uL (ref 142–424)
POC Granulocyte: 5.2 (ref 2–6.9)
POC LYMPH %: 21.9 % (ref 10–50)
POC MID %: 5.2 % (ref 0–12)
RBC: 5.01 M/uL (ref 4.69–6.13)
RDW, POC: 11.9 %
WBC: 7.2 10*3/uL (ref 4.6–10.2)

## 2015-05-17 LAB — GLUCOSE, POCT (MANUAL RESULT ENTRY): POC Glucose: 144 mg/dl — AB (ref 70–99)

## 2015-05-17 MED ORDER — ONDANSETRON HCL 8 MG PO TABS: 8.0000 mg | ORAL_TABLET | Freq: Three times a day (TID) | ORAL | Status: DC | PRN

## 2015-05-17 NOTE — Patient Instructions (Signed)
Rest and drink plenty of fluids, and eat a bland diet.  Let us know if you do not continue to feel better!  I will be in touch with the rest of your labs.  If needed you can use the zofran as needed for nausea

## 2015-05-17 NOTE — Progress Notes (Signed)
[facility] [address], [place] [address] [phone]  Date:  05/17/2015   Name:  Drew LyonsRicky Hernandez   DOB:  [birth year]   MRN:  [id number]  PCP:  No PCP Per Patient    Chief Complaint: Diarrhea   History of Present Illness:  Drew CliffRicky Hernandez is a 29 y.o. very pleasant male patient who presents with the following:  History of obesity, DM2, HTN, dyslipidemia.  He has lost a lot of weight since dx of DM and this is doing well Here today with complaint of illness- he first got sick with a cold last week.  He feels like this is getting better but he is still hoarse. 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 Yesterday he had diarrhea and has felt nauseated.  Diarrhea is resolved today. He is not vomiting at all.  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  No belly pain.  No blood in his stools  He has felt dizzy recently.    Most recent A1c as below Lab Results  Component Value Date   HGBA1C 7.2 03/08/2015     Patient Active Problem List   Diagnosis Date Noted  . Spasm of back muscles 03/14/2015  . Essential hypertension 01/26/2015  . Type 2 diabetes mellitus without complication (HCC) 01/26/2015    Past Medical History  Diagnosis Date  . Diabetes mellitus without complication (HCC)     dx with DM 2 age 89, he was on insulin in the past and lost weight, was over 400 lbs.   . Hypertension     age 29 due to obesity    History reviewed. No pertinent past surgical history.  Social History  Substance Use Topics  . Smoking status: Never Smoker   . Smokeless tobacco: Never Used  . Alcohol Use: 0.0 oz/week    0 Standard drinks or equivalent per week    Family History  Problem Relation Age of Onset  . Cancer Paternal Grandfather   . Hypertension Paternal Grandfather     No Known Allergies  Medication list has been  reviewed and updated.  Current Outpatient Prescriptions on File Prior to Visit  Medication Sig Dispense Refill  . cyclobenzaprine (FLEXERIL) 10 MG tablet Take 1 tablet (10 mg total) by mouth 3 (three) times daily as needed for muscle spasms. 30 tablet 0  . lisinopril (PRINIVIL,ZESTRIL) 20 MG tablet Take 1 tablet (20 mg total) by mouth daily. 90 tablet 1  . meloxicam (MOBIC) 15 MG tablet Take 0.5-1 tablets (7.5-15 mg total) by mouth daily. 30 tablet 0  . metFORMIN (GLUCOPHAGE XR) 500 MG 24 hr tablet Take 1 tab PO TID, then if tolerate take 1 tab at breakfast, 1 tab at lunch,  2 tabs at bedtime. 360 tablet 1  . pravastatin (PRAVACHOL) 40 MG tablet Take 1 tablet (40 mg total) by mouth daily. 90 tablet 3  . sildenafil (VIAGRA) 100 MG tablet Take 1 tablet (100 mg total) by mouth daily as needed for erectile dysfunction. 10 tablet 11   No current facility-administered medications on file prior to visit.    Review of Systems:  As per HPI- otherwise negative.   Physical Examination: Filed Vitals:   05/17/15 1049  BP: 132/80  Pulse: 71  Temp: 99 F (37.2 C)  Resp: 18  Filed Vitals:   05/17/15 1049  Height:  (1.905 m)  Weight: 268 lb (121.564 kg)   Body mass index is 33.5 kg/(m^2). Ideal Body Weight: Weight in (lb) to have BMI = 25: 199.6  GEN: WDWN, NAD, Non-toxic, A & O x 3 HEENT: Atraumatic, Normocephalic. Neck supple. No masses, No LAD. Ears and Nose: No external deformity. CV: RRR, No M/G/R. No JVD. No thrill. No extra heart sounds. PULM: CTA B, no wheezes, crackles, rhonchi. No retractions. No resp. distress. No accessory muscle use. ABD: S, NT, ND, +BS. No rebound. No HSM. EXTR: No c/c/e NEURO Normal gait.  PSYCH: Normally interactive. Conversant. Not depressed or anxious appearing.  Calm demeanor.   Results for orders placed or performed in visit on 05/17/15  POCT CBC  Result Value Ref Range   WBC 7.2 4.6 - 10.2 K/uL   Lymph, poc 1.6 0.6 - 3.4   POC LYMPH  PERCENT 21.9 10 - 50 %L   MID (cbc) 0.4 0 - 0.9   POC MID % 5.2 0 - 12 %M   POC Granulocyte 5.2 2 - 6.9   Granulocyte percent 72.9 37 - 80 %G   RBC 5.01 4.69 - 6.13 M/uL   Hemoglobin 13.8 (A) 14.1 - 18.1 g/dL   HCT, POC 16.1 (A) 09.6 - 53.7 %   MCV 82.4 80 - 97 fL   MCH, POC 27.5 27 - 31.2 pg   MCHC 33.4 31.8 - 35.4 g/dL   RDW, POC 04.5 %   Platelet Count, POC 193 142 - 424 K/uL   MPV 7.5 0 - 99.8 fL  POCT glucose (manual entry)  Result Value Ref Range   POC Glucose 144 (A) 70 - 99 mg/dl     Assessment and Plan: Diarrhea of presumed infectious origin - Plan: POCT CBC, Comprehensive metabolic panel  Controlled type 2 diabetes mellitus with complication, without long-term current use of insulin (HCC) - Plan: POCT glucose (manual entry), Hemoglobin A1c  Nausea without vomiting - Plan: ondansetron (ZOFRAN) 8 MG tablet  Here today with recent illness- he had a cold, then diarrhea.  Diarrhea is now improved.  He would like to have a couple of days off work to rest and recover which is reasonable.  Gave him a note for work and rx for zofran Await other labs and will follow-up with him  Signed Abbe Amsterdam, MD

## 2015-05-18 ENCOUNTER — Encounter: Payer: Self-pay | Admitting: Family Medicine

## 2015-05-18 LAB — HEMOGLOBIN A1C
HEMOGLOBIN A1C: 6 % — AB (ref <5.7)
MEAN PLASMA GLUCOSE: 126 mg/dL — AB (ref <117)

## 2015-06-20 ENCOUNTER — Other Ambulatory Visit: Payer: Self-pay

## 2015-06-20 MED ORDER — SILDENAFIL CITRATE 100 MG PO TABS: 100.0000 mg | ORAL_TABLET | Freq: Every day | ORAL | Status: DC | PRN

## 2015-06-20 NOTE — Telephone Encounter (Signed)
Got req for viagra RFs to be sent to [facility]. I re-sent the remaining RFs from [facility] in [facility] day supplies to [facility].

## 2015-09-18 ENCOUNTER — Ambulatory Visit (INDEPENDENT_AMBULATORY_CARE_PROVIDER_SITE_OTHER): Payer: 59 | Admitting: Family Medicine

## 2015-09-18 VITALS — BP 122/76 | HR 74 | Temp 97.6°F | Resp 14 | Ht 75.0 in | Wt 277.0 lb

## 2015-09-18 DIAGNOSIS — G44209 Tension-type headache, unspecified, not intractable: Secondary | ICD-10-CM

## 2015-09-18 DIAGNOSIS — B349 Viral infection, unspecified: Secondary | ICD-10-CM

## 2015-09-18 DIAGNOSIS — R6883 Chills (without fever): Secondary | ICD-10-CM

## 2015-09-18 LAB — POCT INFLUENZA A/B
INFLUENZA B, POC: NEGATIVE
Influenza A, POC: NEGATIVE

## 2015-09-18 NOTE — Patient Instructions (Addendum)
  Great to meet you!  Please come back if your sym,ptoms worsen or do not get better as expected.

## 2015-09-18 NOTE — Progress Notes (Signed)
   HPI  Patient presents today with flu like illness  He describes 2 days of headache, fatigue, malaise, and body aches.   He denies SOB, chest pain, cough, and n/v/d.   He had chills throughout the night last night.   He is tolerating food and fluid like normal.   He just came [place] the beach and does not know of any sick contacts.   He has well controlled DM2  Describes HA as frontal and occipital, worse with standing, and associated with malaise and fatigue.  It is helped by aleve  PMH: Smoking status noted ROS: Per HPI  Objective: BP 122/76 mmHg  Pulse 74  Temp(Src) 97.6 F (36.4 C) (Oral)  Resp 14  Ht 6\' 3"  (1.905 m)  Wt 277 lb (125.646 kg)  BMI 34.62 kg/m2  SpO2 98% Gen: NAD, alert, cooperative with exam HEENT: NCAT, nares with swolllen turbinates, oropharynx clear  CV: RRR, good S1/S2, no murmur Resp: CTABL, no wheezes, non-labored Ext: No edema, warm Neuro: Alert and oriented, No gross deficits  Assessment and plan:  # Viral illness HA and malaise consistent with viral illness Supportive care, Flu negative 3 days out of work RTC with any worsening or prolonged symps.      Orders Placed This Encounter  Procedures  . POCT Influenza A/B    Kevin FentonSamuel Bradshaw, MD 9:59 AM

## 2015-11-05 ENCOUNTER — Other Ambulatory Visit: Payer: Self-pay

## 2015-11-05 DIAGNOSIS — E119 Type 2 diabetes mellitus without complications: Secondary | ICD-10-CM

## 2015-11-05 DIAGNOSIS — I10 Essential (primary) hypertension: Secondary | ICD-10-CM

## 2015-11-05 MED ORDER — METFORMIN HCL ER 500 MG PO TB24: ORAL_TABLET | ORAL | Status: DC

## 2015-11-05 MED ORDER — LISINOPRIL 20 MG PO TABS: 20.0000 mg | ORAL_TABLET | Freq: Every day | ORAL | Status: DC

## 2016-02-24 ENCOUNTER — Telehealth: Payer: Self-pay

## 2016-02-24 NOTE — Telephone Encounter (Signed)
Patient is calling because [facility]. They would like to faxed to [phone] Attn: Lawson FiscalLori

## 2016-03-07 IMAGING — CR DG THORACIC SPINE 2V
3 series · 3 of 3 positions shown · non-contrast
Comparison: None.

CLINICAL DATA: Pain.

EXAM:
THORACIC SPINE 2 VIEWS

[AP]
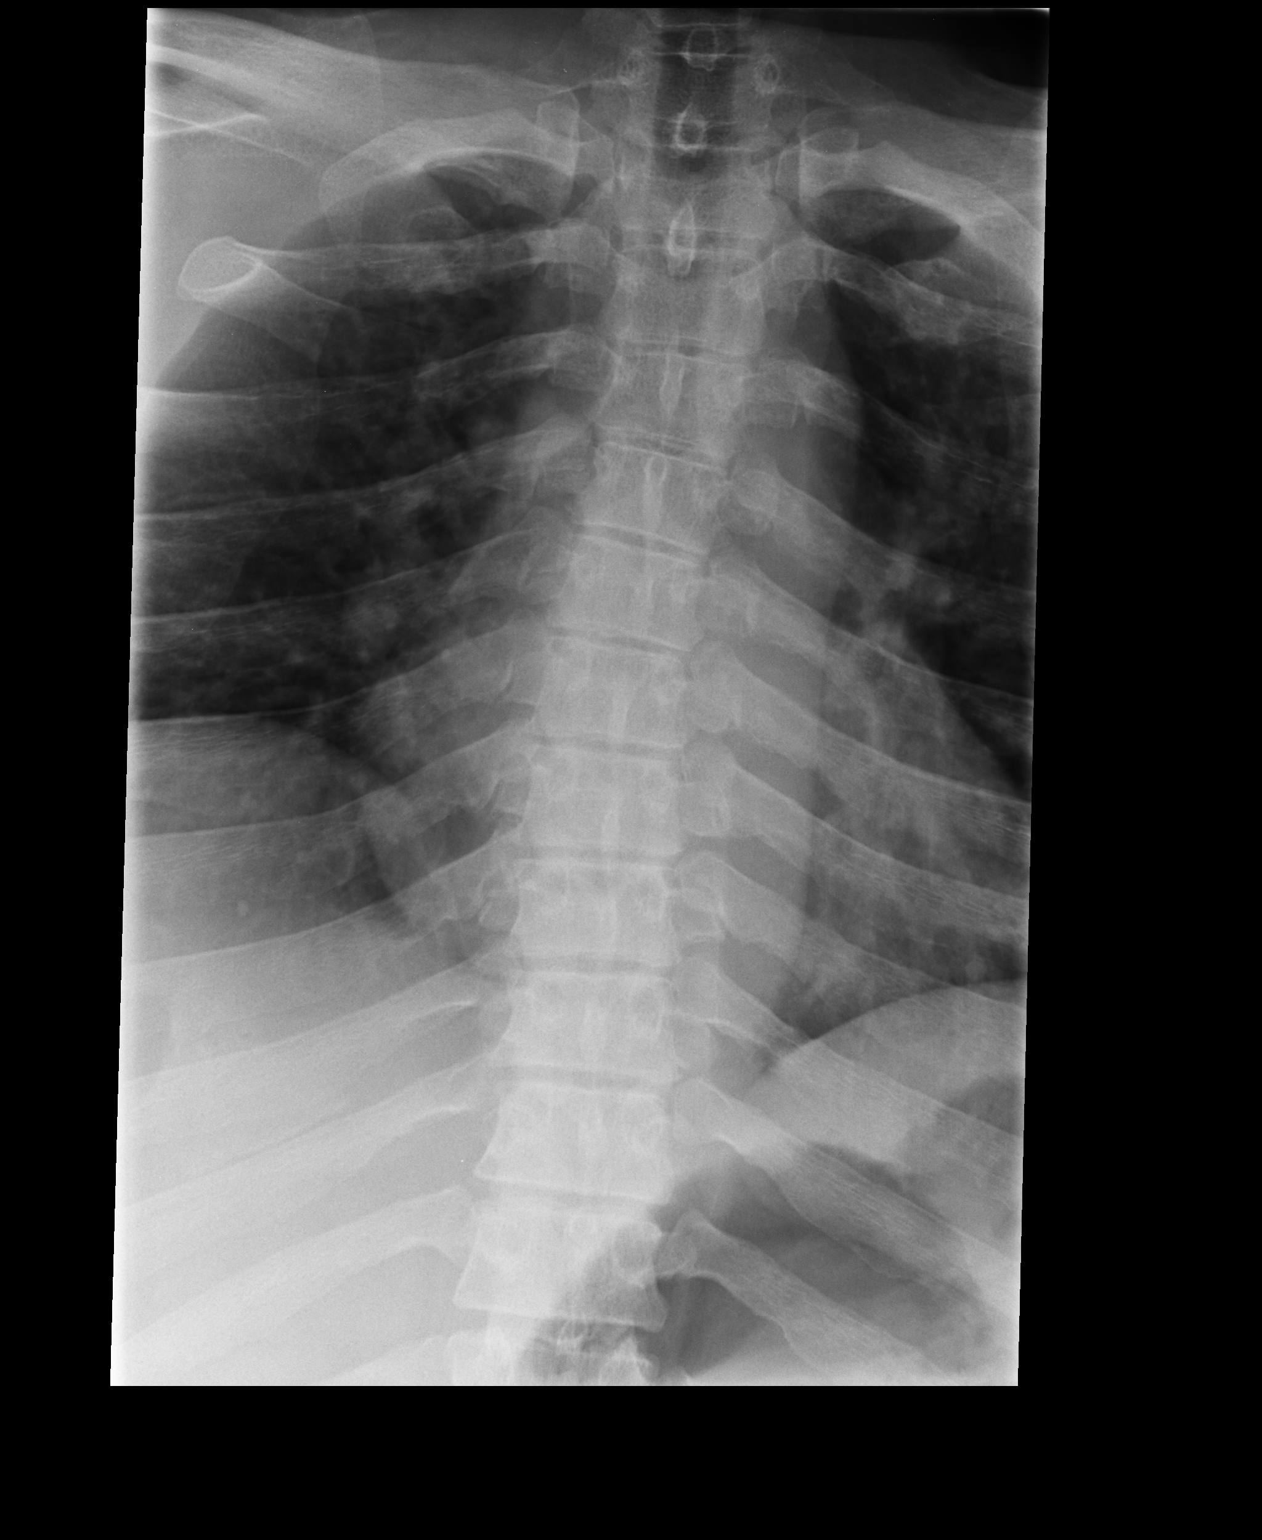

[lateral]
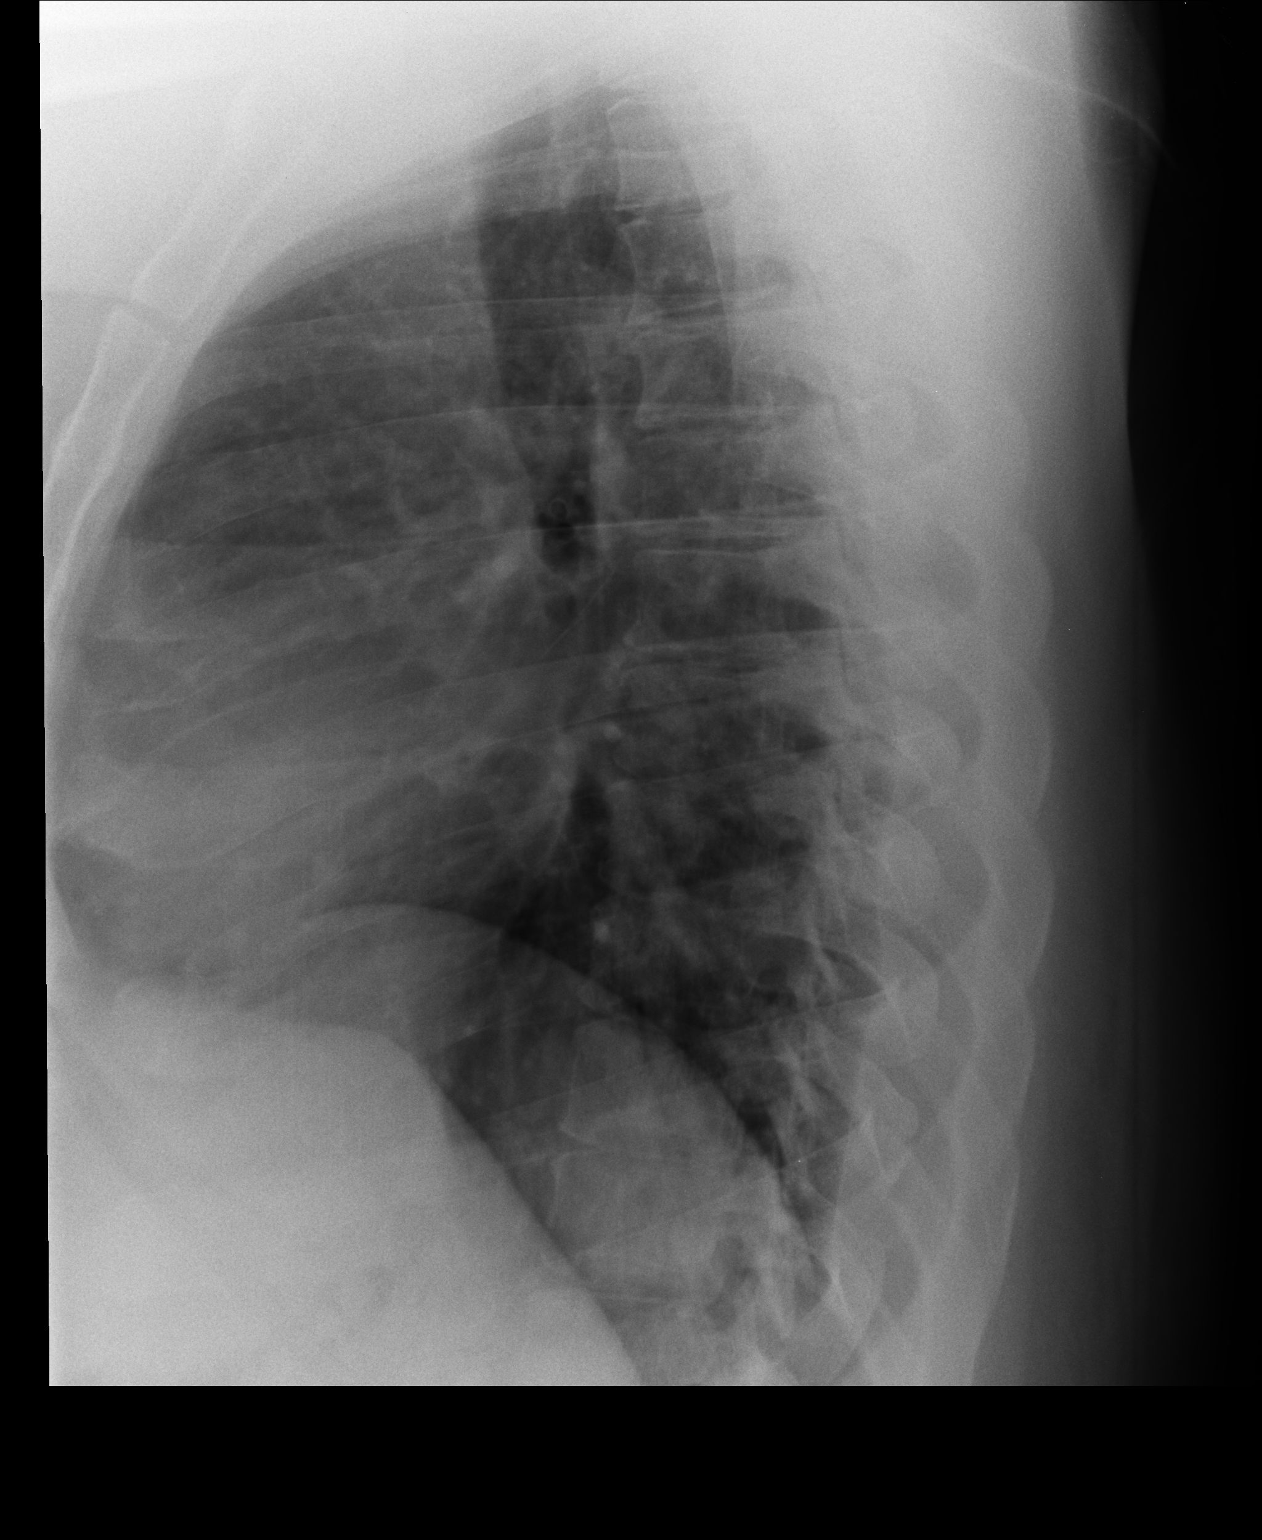

[swimmers]
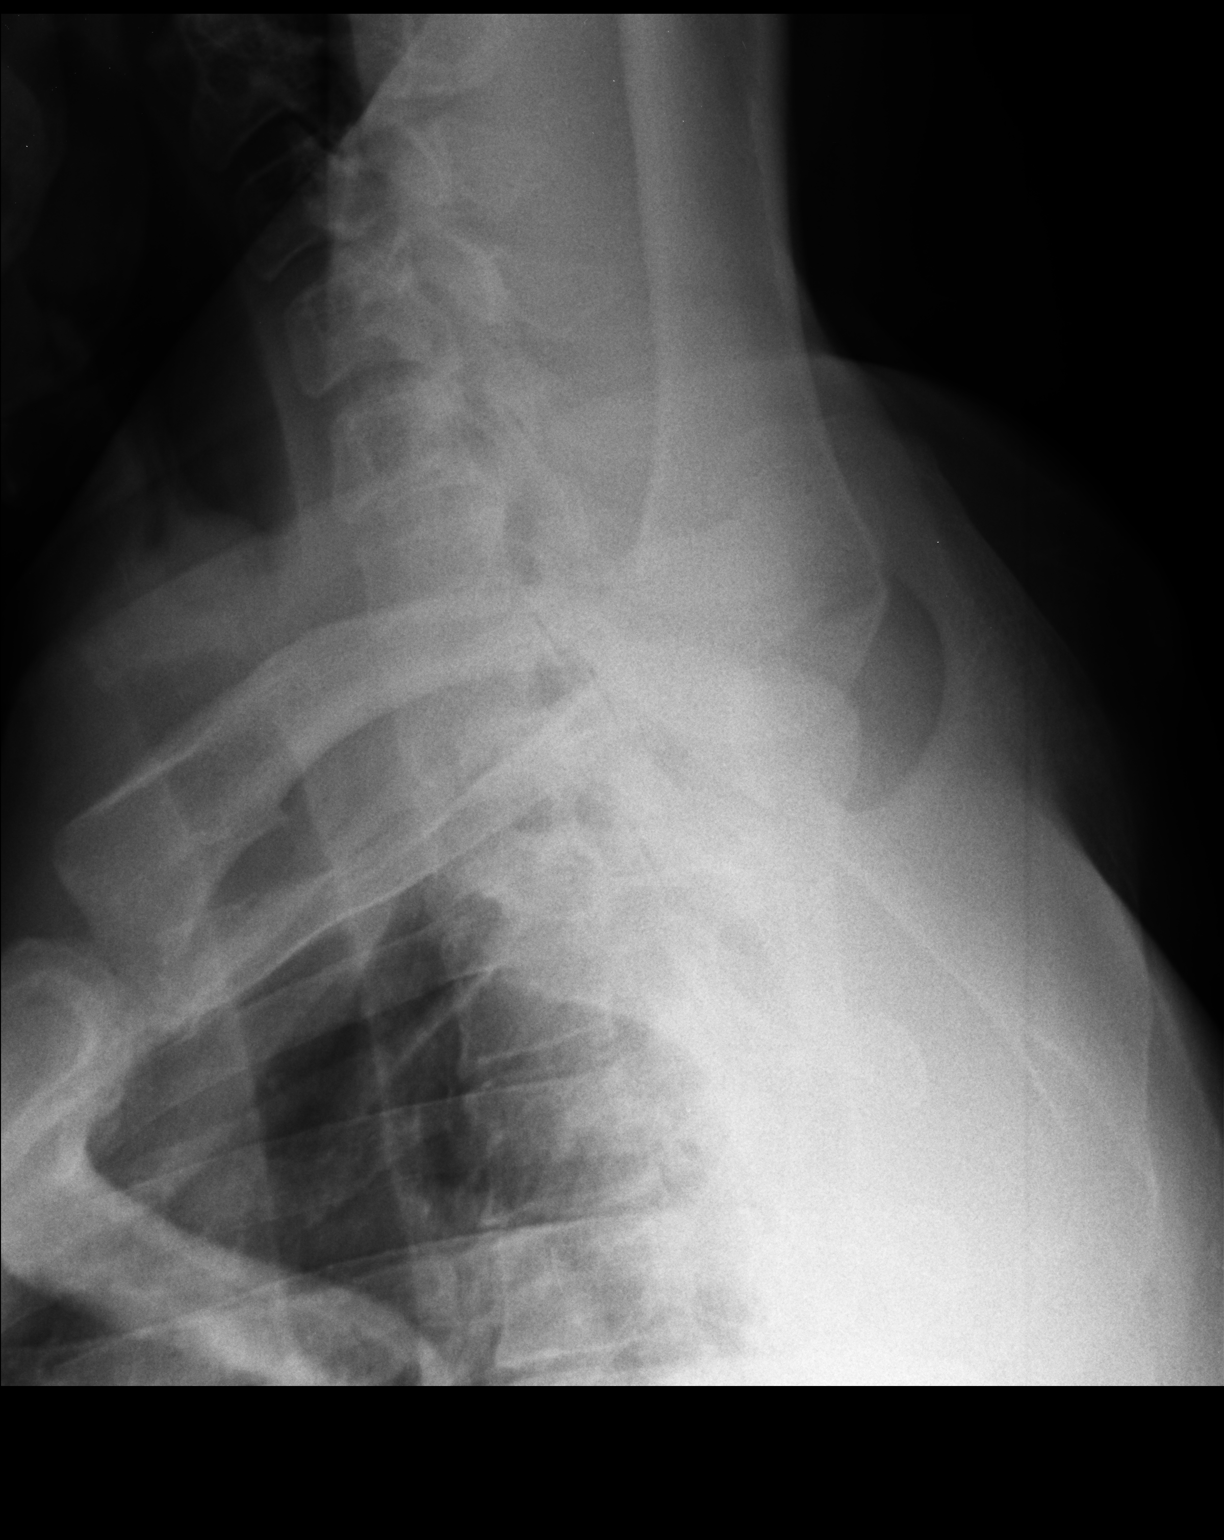

[3 of 3 positions shown; findings below may reference images not displayed]

FINDINGS: Thoraco lumbar scoliosis. No acute bony abnormality. Normal
mineralization.
IMPRESSION: Thoracolumbar scoliosis.  No acute abnormality.

## 2016-06-19 ENCOUNTER — Ambulatory Visit (INDEPENDENT_AMBULATORY_CARE_PROVIDER_SITE_OTHER): Payer: BC Managed Care – PPO | Admitting: Family Medicine

## 2016-06-19 VITALS — BP 130/90 | HR 86 | Temp 98.1°F | Resp 16 | Ht 74.75 in | Wt 284.8 lb

## 2016-06-19 DIAGNOSIS — N521 Erectile dysfunction due to diseases classified elsewhere: Secondary | ICD-10-CM | POA: Diagnosis not present

## 2016-06-19 DIAGNOSIS — E119 Type 2 diabetes mellitus without complications: Secondary | ICD-10-CM

## 2016-06-19 DIAGNOSIS — Z6835 Body mass index (BMI) 35.0-35.9, adult: Secondary | ICD-10-CM

## 2016-06-19 DIAGNOSIS — Z23 Encounter for immunization: Secondary | ICD-10-CM | POA: Diagnosis not present

## 2016-06-19 DIAGNOSIS — I10 Essential (primary) hypertension: Secondary | ICD-10-CM

## 2016-06-19 DIAGNOSIS — E6609 Other obesity due to excess calories: Secondary | ICD-10-CM

## 2016-06-19 DIAGNOSIS — IMO0001 Reserved for inherently not codable concepts without codable children: Secondary | ICD-10-CM

## 2016-06-19 DIAGNOSIS — E785 Hyperlipidemia, unspecified: Secondary | ICD-10-CM | POA: Diagnosis not present

## 2016-06-19 DIAGNOSIS — E1169 Type 2 diabetes mellitus with other specified complication: Secondary | ICD-10-CM | POA: Diagnosis not present

## 2016-06-19 MED ORDER — SILDENAFIL CITRATE 100 MG PO TABS: 100.0000 mg | ORAL_TABLET | Freq: Every day | ORAL | 6 refills | Status: DC | PRN

## 2016-06-19 MED ORDER — LISINOPRIL 20 MG PO TABS: 20.0000 mg | ORAL_TABLET | Freq: Every day | ORAL | 3 refills | Status: DC

## 2016-06-19 MED ORDER — PRAVASTATIN SODIUM 40 MG PO TABS: 40.0000 mg | ORAL_TABLET | Freq: Every day | ORAL | 3 refills | Status: DC

## 2016-06-19 MED ORDER — METFORMIN HCL ER 500 MG PO TB24: ORAL_TABLET | ORAL | 0 refills | Status: DC

## 2016-06-19 NOTE — Progress Notes (Addendum)
Chief Complaint  Patient presents with  . Medication Refill    Viagra 100 mg, Metformin 500mg , Lisinopril 20 mg, Pravastatin 40 mg    HPI  Hypertension: Patient here for follow-up of elevated blood pressure. He is not exercising and is adherent to low salt diet.  Blood pressure is well controlled at home. Cardiac symptoms none. Patient denies chest pain, dyspnea, exertional chest pressure/discomfort and lower extremity edema.  Cardiovascular risk factors: diabetes mellitus, hypertension, male gender and obesity (BMI >= 30 kg/m2). Use of agents associated with hypertension: none. History of target organ damage: none. BP Readings from Last 3 Encounters:  06/19/16 130/90  09/18/15 122/76  05/17/15 132/80   Body mass index is 35.84 kg/m.  Obesity Patient reports that he plans to start a weight loss program.  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 He does not take steroids. He denies any chest pains, palpitations or shortness of breath.  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  He has been without his diabetes medications for at least 4 months.  He states that he was previously compliant with metformin which he took 500mg  XR with breakfast and 1000mg  XR with dinner.  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  He states that he would check his sugars and his levels were 140 fasting.   Symptoms: none. Symptoms have been well-controlled. Patient denies foot ulcerations, nausea, paresthesia of the feet, polydipsia, polyuria, visual disturbances and vomitting.  Evaluation to date has been included: fasting blood sugar and  hemoglobin A1C.  Home sugars: patient does not check sugars.   Dyslipidemia: Patient presents for evaluation of lipids.  Compliance with treatment thus far has been poor.  He lost his insurance and has been out of all his medications. A repeat fasting lipid profile was done.  The patient does not use medications that may worsen dyslipidemias (corticosteroids, progestins, anabolic steroids, diuretics, beta-blockers, amiodarone, cyclosporine, olanzapine). The patient exercises never.  The patient is not known to have coexisting coronary artery disease.   Cardiac Risk Factors Age > 45-male, > 55-male:  NO  Smoking:   NO  Sig. family hx of CHD*:  NO  Hypertension:   YES  +1  Diabetes:   YES  +1  HDL < 35:   YES  +1  HDL > 59:   NO  Total: 3  *- Sig. family h/o CHD per NCEP = MI or sudden death at <55yo in  father or other 1st-degree male relative, or <65yo in mother or  other 1st-degree male relative   Past Medical History:  Diagnosis Date  . Diabetes mellitus without complication (HCC)    dx with DM 2 age 31, he was on insulin in the past and lost weight, was over 400 lbs.   . Hypertension    age 34 due to obesity   Erectile Dysfunction Pt takes 1/2 tablet to one tablet of viagra  Denies side effects of viagra Reports that he has required the medication due to his diabetes He typically gets 3 pills at a  time from the pharmacy due to cost  Current Outpatient Prescriptions  Medication Sig Dispense Refill  . lisinopril (PRINIVIL,ZESTRIL) 20 MG tablet Take 1 tablet (20 mg total) by mouth daily. 90 tablet 3  . metFORMIN (GLUCOPHAGE XR) 500 MG 24 hr tablet Take 1 tab PO TID, then if tolerate take 1 tab at breakfast,  2 tabs at bedtime. 270 tablet 0  . pravastatin (PRAVACHOL) 40 MG tablet Take 1 tablet (40 mg total) by mouth daily. 90 tablet 3  . sildenafil (VIAGRA) 100 MG tablet Take 1 tablet (100 mg total) by mouth daily as needed for erectile dysfunction. 3 tablet 6  .  cyclobenzaprine (FLEXERIL) 10 MG tablet Take 1 tablet (10 mg total) by mouth 3 (three) times daily as needed for muscle spasms. (Patient not taking: Reported on 09/18/2015) 30 tablet 0  . ondansetron (ZOFRAN) 8 MG tablet Take 1 tablet (8 mg total) by mouth every 8 (eight) hours as needed for nausea or vomiting. (Patient not taking: Reported on 09/18/2015) 15 tablet 0   No current facility-administered medications for this visit.     Allergies: No Known Allergies  No past surgical history on file. Family History  Problem Relation Age of Onset  . Cancer Paternal Grandfather   . Hypertension Paternal Grandfather      Social History   Social History  . Marital status: Single    Spouse name: N/A  . Number of children: N/A  . Years of education: N/A   Social History Main Topics  . Smoking status: Never Smoker  . Smokeless tobacco: Never Used  . Alcohol use 0.0 oz/week  . Drug use: No  . Sexual activity: Not Asked   Other Topics Concern  . None   Social History Narrative  . None    Review of Systems  Constitutional: Negative for chills, fever and weight loss.  HENT: Negative for ear discharge, ear pain, hearing loss and tinnitus.   Eyes: Negative for blurred vision, double vision and photophobia.  Respiratory: Negative for cough and wheezing.   Cardiovascular: Negative for chest pain, palpitations, orthopnea, claudication and leg swelling.  Gastrointestinal: Negative for abdominal pain, nausea and vomiting.  Genitourinary: Negative for dysuria, frequency, hematuria and urgency.  Skin: Negative for itching and rash.  Neurological: Negative for dizziness, tingling, tremors, sensory change, speech change, focal weakness and headaches.  Endo/Heme/Allergies: Negative for environmental allergies and polydipsia.  Psychiatric/Behavioral: Negative for depression. The patient is not nervous/anxious.     Objective: Vitals:   06/19/16 1319  BP: 130/90  Pulse: 86  Resp: 16  Temp:  98.1 F (36.7 C)  TempSrc: Oral  SpO2: 96%  Weight: 284 lb 12.8 oz (129.2 kg)  Height: 6' 2.75" (1.899 m)   Wt Readings from Last 3 Encounters:  06/19/16 284 lb 12.8 oz (129.2 kg)  09/18/15 277 lb (125.6 kg)  05/17/15 268 lb (121.6 kg)    Physical Exam  Constitutional: He is oriented to person, place, and time. He appears well-developed and well-nourished.  HENT:  Head: Normocephalic and atraumatic.  Right Ear: External ear normal.  Left Ear: External ear normal.  Mouth/Throat: Oropharynx is clear and moist.  Eyes: Conjunctivae and EOM are normal.  Neck: Normal range of motion. Neck supple.  Cardiovascular: Normal rate, regular rhythm and normal heart sounds.   No murmur heard. Pulmonary/Chest: Effort normal and breath sounds normal. No respiratory distress. He has no wheezes.  Abdominal: Soft. Bowel sounds are normal. He exhibits no distension. There is no tenderness.  Musculoskeletal: Normal range of motion. He exhibits no edema.  Neurological: He is alert and oriented to person, place, and time.  Skin: Skin is warm. Capillary refill takes less than 2 seconds. Rash noted. No erythema.  Psychiatric: He has a normal mood and affect. His behavior is normal. Judgment and thought content normal.    Assessment and Plan Drew Hernandez was seen today for medication refill.  Diagnoses and all orders for this visit:  Essential hypertension -     Comprehensive metabolic panel -     Lipid panel -     CBC with Differential/Platelet -     Cancel: Microalbumin, urine -     metFORMIN (GLUCOPHAGE XR) 500 MG 24 hr tablet; Take 1 tab PO TID, then if tolerate take 1 tab at breakfast,  2 tabs at bedtime. -     lisinopril (PRINIVIL,ZESTRIL) 20 MG tablet; Take 1 tablet (20 mg total) by mouth daily.  Type 2 diabetes mellitus without complication, without long-term current use of insulin (HCC) -     Comprehensive metabolic panel -     Cancel: Microalbumin, urine -     Hemoglobin A1c -     metFORMIN  (GLUCOPHAGE XR) 500 MG 24 hr tablet; Take 1 tab PO TID, then if tolerate take 1 tab at breakfast,  2 tabs at bedtime. -     lisinopril (PRINIVIL,ZESTRIL) 20 MG tablet; Take 1 tablet (20 mg total) by mouth daily. -     Ambulatory referral to diabetic education  Dyslipidemia- will restart medications Discussed need for dietary modification and medication compliance Discussed nutrition referral -     Comprehensive metabolic panel -     Lipid panel -     TSH  Class 2 obesity due to excess calories with serious comorbidity and body mass index (BMI) of 35.0 to 35.9 in adult- discussed need for a 7-10%weight loss to improve comorbidities  Erectile dysfunction associated with type 2 diabetes mellitus (HCC)- refilled viagra 100mg   Pt unable to afford a month's supply so script was changed to 3 tablets wit refills  Other orders -     pravastatin (PRAVACHOL) 40 MG tablet; Take 1 tablet (40 mg total) by mouth daily. -     Discontinue: sildenafil (VIAGRA) 100 MG tablet; Take 1 tablet (100 mg total) by mouth daily as needed for erectile dysfunction. -     sildenafil (VIAGRA) 100 MG tablet; Take 1 tablet (100 mg total) by mouth daily as needed for erectile dysfunction.  Encounter for vaccines - discussed the importance of proper vaccinations especially since he is a diabetes with metabolic syndrome Pt declined all vaccinations  Future plan: discuss aspirin therapy and reapproach vaccinations   Drew Hernandez

## 2016-06-19 NOTE — Patient Instructions (Addendum)
IF you received an x-ray today, you will receive an invoice from [facility]. Please contact [facility] at [phone] with questions or concerns regarding your invoice.   IF you received labwork today, you will receive an invoice from [facility]. Please contact [facility] at [phone] with questions or concerns regarding your invoice.   Our billing staff will not be able to assist you with questions regarding bills from these companies.  You will be contacted with the lab results as soon as they are available. The fastest way to get your results is to activate your My Chart account. Instructions are located on the last page of this paperwork. If you have not heard from [place] regarding the results in 2 weeks, please contact this office.     Diabetes Mellitus and Standards of Medical Care Managing diabetes (diabetes mellitus) can be complicated. Your diabetes treatment may be managed by a team of health care providers, including:  A diet and nutrition specialist (registered dietitian).  A nurse.  A certified diabetes educator (CDE).  A diabetes specialist (endocrinologist).  An eye doctor.  A primary care provider.  A dentist. Your health care providers follow a schedule in order to help you get the best quality of care. The following schedule is a general guideline for your diabetes management plan. Your health care providers may also give you more specific instructions. HbA1c ( hemoglobin A1c) test This test provides information about blood sugar (glucose) control over the previous 2-3 months. It is used to check whether your diabetes management plan needs to be adjusted.  If you are meeting your treatment goals, this test is done at least 2 times a year.  If you are not meeting treatment goals or if your treatment goals have changed, this test is done 4 times a year. Blood pressure test  This test is done at every routine medical visit. For most people,  the goal is less than 140/90. In some cases, your goal blood pressure may be 130/80 or less. Ask your health care provider what your goal blood pressure should be. Dental and eye exams  Visit your dentist two times a year.  If you have type 1 diabetes, get an eye exam 3-5 years after you are diagnosed, and then once a year after your first exam.  If you were diagnosed with type 1 diabetes as a child, get an eye exam when you are age 23 or older and have had diabetes for 3-5 years. After the first exam, you should get an eye exam once a year.  If you have type 2 diabetes, have an eye exam as soon as you are diagnosed, and then once a year after your first exam. Foot care exam  Visual foot exams are done at every routine medical visit. The exams check for cuts, bruises, redness, blisters, sores, or other problems with the feet.  A complete foot exam is done by your health care provider once a year. This exam includes an inspection of the structure and skin of your feet, and a check of the pulses and sensation in your feet.  Type 1 diabetes: Get your first exam 3-5 years after diagnosis.  Type 2 diabetes: Get your first exam as soon as you are diagnosed.  Check your feet every day for cuts, bruises, redness, blisters, or sores. If you have any of these or other problems that are not healing, contact your health care provider. Kidney function test ( urine microalbumin)  This test  is done once a year.  Type 1 diabetes: Get your first test 5 years after diagnosis.  Type 2 diabetes: Get your first test as soon as you are diagnosed.  If you have chronic kidney disease (CKD), get a serum creatinine and estimated glomerular filtration rate (eGFR) test once a year. Lipid profile (cholesterol, HDL, LDL, triglycerides)  This test should be done when you are diagnosed with diabetes, and every 5 years after the first test. If you are on medicines to lower your cholesterol, you may need to get this  test done every year.  The goal for LDL is less than 100 mg/dL (5.5 mmol/L). If you are at high risk, the goal is less than 70 mg/dL (3.9 mmol/L).  The goal for HDL is 40 mg/dL (2.2 mmol/L) for men and 50 mg/dL(2.8 mmol/L) for women. An HDL cholesterol of 60 mg/dL (3.3 mmol/L) or higher gives some protection against heart disease.  The goal for triglycerides is less than 150 mg/dL (8.3 mmol/L). Immunizations  The yearly flu (influenza) vaccine is recommended for everyone 6 months or older who has diabetes.  The pneumonia (pneumococcal) vaccine is recommended for everyone 2 years or older who has diabetes. If you are 78 or older, you may get the pneumonia vaccine as a series of two separate shots.  The hepatitis B vaccine is recommended for adults shortly after they have been diagnosed with diabetes.  The Tdap (tetanus, diphtheria, and pertussis) vaccine should be given:  According to normal childhood vaccination schedules, for children.  Every 10 years, for adults who have diabetes.  The shingles vaccine is recommended for people who have had chicken pox and are 50 years or older. Mental and emotional health  Screening for symptoms of eating disorders, anxiety, and depression is recommended at the time of diagnosis and afterward as needed. If your screening shows that you have symptoms (you have a positive screening result), you may need further evaluation and be referred to a mental health care provider. Diabetes self-management education  Education about how to manage your diabetes is recommended at diagnosis and ongoing as needed. Treatment plan  Your treatment plan will be reviewed at every medical visit. Summary  Managing diabetes (diabetes mellitus) can be complicated. Your diabetes treatment may be managed by a team of health care providers.  Your health care providers follow a schedule in order to help you get the best quality of care.  Standards of care including having  regular physical exams, blood tests, blood pressure monitoring, immunizations, screening tests, and education about how to manage your diabetes.  Your health care providers may also give you more specific instructions based on your individual health. This information is not intended to replace advice given to you by your health care provider. Make sure you discuss any questions you have with your health care provider. Document Released: 03/15/2009 Document Revised: 02/14/2016 Document Reviewed: 02/14/2016 Elsevier Interactive Patient Education  2017 [employer].

## 2016-06-20 LAB — CBC WITH DIFFERENTIAL/PLATELET
BASOS ABS: 0 10*3/uL (ref 0.0–0.2)
Basos: 1 %
EOS (ABSOLUTE): 0.1 10*3/uL (ref 0.0–0.4)
Eos: 1 %
Hematocrit: 42.7 % (ref 37.5–51.0)
Hemoglobin: 14.4 g/dL (ref 13.0–17.7)
Immature Grans (Abs): 0 10*3/uL (ref 0.0–0.1)
Immature Granulocytes: 0 %
LYMPHS ABS: 1.7 10*3/uL (ref 0.7–3.1)
Lymphs: 24 %
MCH: 27 pg (ref 26.6–33.0)
MCHC: 33.7 g/dL (ref 31.5–35.7)
MCV: 80 fL (ref 79–97)
MONOS ABS: 0.4 10*3/uL (ref 0.1–0.9)
Monocytes: 5 %
NEUTROS ABS: 5 10*3/uL (ref 1.4–7.0)
Neutrophils: 69 %
PLATELETS: 257 10*3/uL (ref 150–379)
RBC: 5.33 x10E6/uL (ref 4.14–5.80)
RDW: 12.7 % (ref 12.3–15.4)
WBC: 7.2 10*3/uL (ref 3.4–10.8)

## 2016-06-20 LAB — HEMOGLOBIN A1C
Est. average glucose Bld gHb Est-mCnc: 212 mg/dL
HEMOGLOBIN A1C: 9 % — AB (ref 4.8–5.6)

## 2016-06-20 LAB — COMPREHENSIVE METABOLIC PANEL
A/G RATIO: 1.5 (ref 1.2–2.2)
ALBUMIN: 5 g/dL (ref 3.5–5.5)
ALT: 15 IU/L (ref 0–44)
AST: 13 IU/L (ref 0–40)
Alkaline Phosphatase: 70 IU/L (ref 39–117)
BUN / CREAT RATIO: 10 (ref 9–20)
BUN: 9 mg/dL (ref 6–20)
Bilirubin Total: 0.5 mg/dL (ref 0.0–1.2)
CALCIUM: 10 mg/dL (ref 8.7–10.2)
CO2: 26 mmol/L (ref 18–29)
Chloride: 95 mmol/L — ABNORMAL LOW (ref 96–106)
Creatinine, Ser: 0.86 mg/dL (ref 0.76–1.27)
GFR, EST AFRICAN AMERICAN: 134 mL/min/{1.73_m2} (ref >59)
GFR, EST NON AFRICAN AMERICAN: 116 mL/min/{1.73_m2} (ref >59)
GLUCOSE: 249 mg/dL — AB (ref 65–99)
Globulin, Total: 3.4 g/dL (ref 1.5–4.5)
Potassium: 4.4 mmol/L (ref 3.5–5.2)
Sodium: 137 mmol/L (ref 134–144)
TOTAL PROTEIN: 8.4 g/dL (ref 6.0–8.5)

## 2016-06-20 LAB — LIPID PANEL
CHOL/HDL RATIO: 6.8 ratio — AB (ref 0.0–5.0)
Cholesterol, Total: 225 mg/dL — ABNORMAL HIGH (ref 100–199)
HDL: 33 mg/dL — AB (ref >39)
LDL Calculated: 159 mg/dL — ABNORMAL HIGH (ref 0–99)
Triglycerides: 167 mg/dL — ABNORMAL HIGH (ref 0–149)
VLDL CHOLESTEROL CAL: 33 mg/dL (ref 5–40)

## 2016-06-20 LAB — TSH: TSH: 0.777 u[IU]/mL (ref 0.450–4.500)

## 2016-06-21 ENCOUNTER — Encounter: Payer: Self-pay | Admitting: Family Medicine

## 2016-08-26 ENCOUNTER — Ambulatory Visit: Payer: Self-pay | Admitting: Family Medicine

## 2016-12-03 ENCOUNTER — Ambulatory Visit (INDEPENDENT_AMBULATORY_CARE_PROVIDER_SITE_OTHER): Payer: BC Managed Care – PPO | Admitting: Family Medicine

## 2016-12-03 ENCOUNTER — Encounter: Payer: Self-pay | Admitting: Family Medicine

## 2016-12-03 VITALS — BP 127/80 | HR 74 | Temp 97.9°F | Resp 18 | Ht 74.09 in | Wt 267.2 lb

## 2016-12-03 DIAGNOSIS — E6609 Other obesity due to excess calories: Secondary | ICD-10-CM

## 2016-12-03 DIAGNOSIS — IMO0001 Reserved for inherently not codable concepts without codable children: Secondary | ICD-10-CM

## 2016-12-03 DIAGNOSIS — E119 Type 2 diabetes mellitus without complications: Secondary | ICD-10-CM | POA: Diagnosis not present

## 2016-12-03 DIAGNOSIS — Z23 Encounter for immunization: Secondary | ICD-10-CM | POA: Diagnosis not present

## 2016-12-03 DIAGNOSIS — I10 Essential (primary) hypertension: Secondary | ICD-10-CM | POA: Diagnosis not present

## 2016-12-03 DIAGNOSIS — E785 Hyperlipidemia, unspecified: Secondary | ICD-10-CM | POA: Insufficient documentation

## 2016-12-03 DIAGNOSIS — Z6835 Body mass index (BMI) 35.0-35.9, adult: Secondary | ICD-10-CM

## 2016-12-03 LAB — COMPREHENSIVE METABOLIC PANEL
ALK PHOS: 73 IU/L (ref 39–117)
ALT: 12 IU/L (ref 0–44)
AST: 11 IU/L (ref 0–40)
Albumin/Globulin Ratio: 1.5 (ref 1.2–2.2)
Albumin: 4.8 g/dL (ref 3.5–5.5)
BUN/Creatinine Ratio: 11 (ref 9–20)
BUN: 9 mg/dL (ref 6–20)
Bilirubin Total: 0.3 mg/dL (ref 0.0–1.2)
CO2: 23 mmol/L (ref 20–29)
CREATININE: 0.81 mg/dL (ref 0.76–1.27)
Calcium: 10 mg/dL (ref 8.7–10.2)
Chloride: 99 mmol/L (ref 96–106)
GFR calc Af Amer: 137 mL/min/{1.73_m2} (ref >59)
GFR calc non Af Amer: 118 mL/min/{1.73_m2} (ref >59)
GLUCOSE: 317 mg/dL — AB (ref 65–99)
Globulin, Total: 3.3 g/dL (ref 1.5–4.5)
Potassium: 4.9 mmol/L (ref 3.5–5.2)
Sodium: 137 mmol/L (ref 134–144)
Total Protein: 8.1 g/dL (ref 6.0–8.5)

## 2016-12-03 LAB — LIPID PANEL
Chol/HDL Ratio: 6.4 ratio — ABNORMAL HIGH (ref 0.0–5.0)
Cholesterol, Total: 212 mg/dL — ABNORMAL HIGH (ref 100–199)
HDL: 33 mg/dL — ABNORMAL LOW (ref >39)
LDL Calculated: 161 mg/dL — ABNORMAL HIGH (ref 0–99)
Triglycerides: 89 mg/dL (ref 0–149)
VLDL CHOLESTEROL CAL: 18 mg/dL (ref 5–40)

## 2016-12-03 LAB — POCT GLYCOSYLATED HEMOGLOBIN (HGB A1C): HEMOGLOBIN A1C: 10.4

## 2016-12-03 MED ORDER — SITAGLIPTIN PHOSPHATE 100 MG PO TABS
100.0000 mg | ORAL_TABLET | Freq: Every day | ORAL | 0 refills | Status: DC
Start: 2016-12-03 — End: 2017-07-08

## 2016-12-03 MED ORDER — LISINOPRIL 20 MG PO TABS: 20.0000 mg | ORAL_TABLET | Freq: Every day | ORAL | 1 refills | Status: DC

## 2016-12-03 MED ORDER — METFORMIN HCL ER 500 MG PO TB24: ORAL_TABLET | ORAL | 1 refills | Status: DC

## 2016-12-03 MED ORDER — PRAVASTATIN SODIUM 40 MG PO TABS: 40.0000 mg | ORAL_TABLET | Freq: Every day | ORAL | 1 refills | Status: DC

## 2016-12-03 NOTE — Assessment & Plan Note (Signed)
Continue Pravachol and a healthy diet

## 2016-12-03 NOTE — Patient Instructions (Addendum)
IF you received an x-ray today, you will receive an invoice from [facility]. Please contact [facility] at [phone] with questions or concerns regarding your invoice.   IF you received labwork today, you will receive an invoice from [facility]. Please contact [facility] at [phone] with questions or concerns regarding your invoice.   Our billing staff will not be able to assist you with questions regarding bills from these companies.  You will be contacted with the lab results as soon as they are available. The fastest way to get your results is to activate your My Chart account. Instructions are located on the last page of this paperwork. If you have not heard from us regarding the results in 2 weeks, please contact this office.     Sitagliptin oral tablet What is this medicine? SITAGLIPTIN (sit a GLIP tin) helps to treat type 2 diabetes. It helps to control blood sugar. Treatment is combined with diet and exercise. This medicine may be used for other purposes; ask your health care provider or pharmacist if you have questions. COMMON BRAND NAME(S): Januvia What should I tell my health care provider before I take this medicine? They need to know if you have any of these conditions: -diabetic ketoacidosis -kidney disease -pancreatitis -previous swelling of the tongue, face, or lips with difficulty breathing, difficulty swallowing, hoarseness, or tightening of the throat -type 1 diabetes -an unusual or allergic reaction to sitagliptin, other medicines, foods, dyes, or preservatives -pregnant or trying to get pregnant -breast-feeding How should I use this medicine? Take this medicine by mouth with a glass of water. Follow the directions on the prescription label. You can take it with or without food. Do not cut, crush or chew this medicine. Take your dose at the same time each day. Do not take more often than directed. Do not stop taking except on your doctor's  advice. A special MedGuide will be given to you by the pharmacist with each prescription and refill. Be sure to read this information carefully each time. Talk to your pediatrician regarding the use of this medicine in children. Special care may be needed. Overdosage: If you think you have taken too much of this medicine contact a poison control center or emergency room at once. NOTE: This medicine is only for you. Do not share this medicine with others. What if I miss a dose? If you miss a dose, take it as soon as you can. If it is almost time for your next dose, take only that dose. Do not take double or extra doses. What may interact with this medicine? Do not take this medicine with any of the following medications: -gatifloxacin This medicine may also interact with the following medications: -alcohol -digoxin -insulin -sulfonylureas like glimepiride, glipizide, glyburide This list may not describe all possible interactions. Give your health care provider a list of all the medicines, herbs, non-prescription drugs, or dietary supplements you use. Also tell them if you smoke, drink alcohol, or use illegal drugs. Some items may interact with your medicine. What should I watch for while using this medicine? Visit your doctor or health care professional for regular checks on your progress. A test called the HbA1C (A1C) will be monitored. This is a simple blood test. It measures your blood sugar control over the last 2 to 3 months. You will receive this test every 3 to 6 months. Learn how to check your blood sugar. Learn the symptoms of low and high blood sugar and how  to manage them. Always carry a quick-source of sugar with you in case you have symptoms of low blood sugar. Examples include hard sugar candy or glucose tablets. Make sure others know that you can choke if you eat or drink when you develop serious symptoms of low blood sugar, such as seizures or unconsciousness. They must get medical  help at once. Tell your doctor or health care professional if you have high blood sugar. You might need to change the dose of your medicine. If you are sick or exercising more than usual, you might need to change the dose of your medicine. Do not skip meals. Ask your doctor or health care professional if you should avoid alcohol. Many nonprescription cough and cold products contain sugar or alcohol. These can affect blood sugar. Wear a medical ID bracelet or chain, and carry a card that describes your disease and details of your medicine and dosage times. What side effects may I notice from receiving this medicine? Side effects that you should report to your doctor or health care professional as soon as possible: -allergic reactions like skin rash, itching or hives, swelling of the face, lips, or tongue -breathing problems -general ill feeling or flu-like symptoms -joint pain -loss of appetite -redness, blistering, peeling or loosening of the skin, including inside the mouth -signs and symptoms of low blood sugar such as feeling anxious, confusion, dizziness, increased hunger, unusually weak or tired, sweating, shakiness, cold, irritable, headache, blurred vision, fast heartbeat, loss of consciousness -unusual stomach pain or discomfort -vomiting Side effects that usually do not require medical attention (report to your doctor or health care professional if they continue or are bothersome): -diarrhea -headache -sore throat -stomach upset -stuffy or runny nose This list may not describe all possible side effects. Call your doctor for medical advice about side effects. You may report side effects to FDA at 1-800-FDA-1088. Where should I keep my medicine? Keep out of the reach of children. Store at room temperature between 15 and 30 degrees C (59 and 86 degrees F). Throw away any unused medicine after the expiration date. NOTE: This sheet is a summary. It may not cover all possible information.  If you have questions about this medicine, talk to your doctor, pharmacist, or health care provider.  2018 Elsevier/Gold Standard (2015-11-15 14:23:55)

## 2016-12-03 NOTE — Assessment & Plan Note (Signed)
Weight improved, continue exercise

## 2016-12-03 NOTE — Assessment & Plan Note (Signed)
Continue current medications.  Normal renal function. DASH diet

## 2016-12-03 NOTE — Progress Notes (Signed)
Chief Complaint  Patient presents with  . Medication Refill    lisinopril, metformin hci, pravachol    HPI   Hypertension: Patient here for follow-up of elevated blood pressure. He is exercising and is adherent to low salt diet.  Blood pressure is well controlled at home. Cardiac symptoms none. Patient denies chest pain, chest pressure/discomfort, dyspnea, fatigue, irregular heart beat and lower extremity edema.  Cardiovascular risk factors: diabetes mellitus, dyslipidemia, hypertension, male gender and obesity (BMI >= 30 kg/m2). Use of agents associated with hypertension: none. History of target organ damage: none. BP Readings from Last 3 Encounters:  12/03/16 127/80  06/19/16 130/90  09/18/15 122/76   Lab Results  Component Value Date   CREATININE 0.86 06/19/2016   Obesity He is drinking a gallon a day of water and watching his intake of food He is at the gym weekly He feels like his body is getting more defined Wt Readings from Last 3 Encounters:  12/03/16 267 lb 3.2 oz (121.2 kg)  06/19/16 284 lb 12.8 oz (129.2 kg)  09/18/15 277 lb (125.6 kg)  Body mass index is 34.22 kg/m.   Diabetes Mellitus: Patient presents for follow up of diabetes. Symptoms: none. Symptoms have been well-controlled. Patient denies foot ulcerations, hyperglycemia, hypoglycemia , increase appetite, nausea, paresthesia of the feet and polydipsia.  Evaluation to date has been included: fasting blood sugar and hemoglobin A1C.  Home sugars: patient does not check sugars.   Lab Results  Component Value Date   HGBA1C 10.4 12/03/2016   Hyperlipidemia: Patient presents with hyperlipidemia.  He was tested because of his diabetes.  His last labs showed see below. negative. There is a family history of hyperlipidemia. There is not a family history of early ischemia heart disease.  Lab Results  Component Value Date   CHOL 225 (H) 06/19/2016   CHOL 199 03/08/2015   CHOL 208 (H) 01/25/2015   Lab Results    Component Value Date   HDL 33 (L) 06/19/2016   HDL 34 (L) 03/08/2015   HDL 34 (L) 01/25/2015   Lab Results  Component Value Date   LDLCALC 159 (H) 06/19/2016   LDLCALC 153 (H) 03/08/2015   LDLCALC 151 (H) 01/25/2015   Lab Results  Component Value Date   TRIG 167 (H) 06/19/2016   TRIG 59 03/08/2015   TRIG 113 01/25/2015   Lab Results  Component Value Date   CHOLHDL 6.8 (H) 06/19/2016   CHOLHDL 5.9 (H) 03/08/2015   CHOLHDL 6.1 (H) 01/25/2015   No results found for: LDLDIRECT   Past Medical History:  Diagnosis Date  . Diabetes mellitus without complication (HCC)    dx with DM 2 age 25, he was on insulin in the past and lost weight, was over 400 lbs.   . Hypertension    age 55 due to obesity    Current Outpatient Prescriptions  Medication Sig Dispense Refill  . lisinopril (PRINIVIL,ZESTRIL) 20 MG tablet Take 1 tablet (20 mg total) by mouth daily. 90 tablet 1  . metFORMIN (GLUCOPHAGE XR) 500 MG 24 hr tablet Take one 1 tab at breakfast,  2 tabs at bedtime. 270 tablet 1  . pravastatin (PRAVACHOL) 40 MG tablet Take 1 tablet (40 mg total) by mouth daily. 90 tablet 1  . sitaGLIPtin (JANUVIA) 100 MG tablet Take 1 tablet (100 mg total) by mouth daily. 90 tablet 0   No current facility-administered medications for this visit.     Allergies: No Known Allergies  History reviewed. No pertinent  surgical history.  Social History   Social History  . Marital status: Single    Spouse name: N/A  . Number of children: N/A  . Years of education: N/A   Social History Main Topics  . Smoking status: Never Smoker  . Smokeless tobacco: Never Used  . Alcohol use 0.0 oz/week  . Drug use: No  . Sexual activity: Not Asked   Other Topics Concern  . None   Social History Narrative  . None    ROS  Review of Systems See HPI Constitution: No fevers or chills No malaise No diaphoresis Skin: No rash or itching Eyes: no blurry vision, no double vision GU: no dysuria or  hematuria Neuro: no dizziness or headaches   Objective: Vitals:   12/03/16 0939 12/03/16 0947  BP: (!) 170/98 127/80  Pulse: 85 74  Resp: 18   Temp: 97.9 F (36.6 C)   TempSrc: Oral   SpO2: 98%   Weight: 267 lb 3.2 oz (121.2 kg)   Height: 6' 2.09" (1.882 m)     Physical Exam  Constitutional: He is oriented to person, place, and time. He appears well-developed and well-nourished.  HENT:  Head: Normocephalic and atraumatic.  Eyes: Conjunctivae and EOM are normal.  Cardiovascular: Normal rate, regular rhythm and normal heart sounds.   Pulmonary/Chest: Effort normal and breath sounds normal. No respiratory distress. He has no wheezes.  Neurological: He is alert and oriented to person, place, and time.   Diabetic Foot Exam - Simple   No data filed      Assessment and Plan Raekwon was seen today for medication refill.  Diagnoses and all orders for this visit:  Need for vaccination Comments: discussed vaccinations for diabetes standard of care  Other orders -     Pneumococcal polysaccharide vaccine 23-valent greater than or equal to 2yo subcutaneous/IM -     pravastatin (PRAVACHOL) 40 MG tablet; Take 1 tablet (40 mg total) by mouth daily. -     sitaGLIPtin (JANUVIA) 100 MG tablet; Take 1 tablet (100 mg total) by mouth daily.   Problem List Items Addressed This Visit      Cardiovascular and Mediastinum   Essential hypertension - Primary    Continue current medications.  Normal renal function. DASH diet      Relevant Medications   lisinopril (PRINIVIL,ZESTRIL) 20 MG tablet   metFORMIN (GLUCOPHAGE XR) 500 MG 24 hr tablet   pravastatin (PRAVACHOL) 40 MG tablet   Other Relevant Orders   Lipid panel   Comprehensive metabolic panel     Endocrine   Type 2 diabetes mellitus without complication (HCC)    Worsening a1c despite weight loss, diabetic diet and metformin Will try Januvia      Relevant Medications   lisinopril (PRINIVIL,ZESTRIL) 20 MG tablet   metFORMIN  (GLUCOPHAGE XR) 500 MG 24 hr tablet   pravastatin (PRAVACHOL) 40 MG tablet   sitaGLIPtin (JANUVIA) 100 MG tablet   Other Relevant Orders   POCT glycosylated hemoglobin (Hb A1C) (Completed)   HM Diabetes Foot Exam (Completed)     Other   Class 2 obesity due to excess calories with serious comorbidity and body mass index (BMI) of 35.0 to 35.9 in adult    Weight improved, continue exercise      Relevant Medications   metFORMIN (GLUCOPHAGE XR) 500 MG 24 hr tablet   sitaGLIPtin (JANUVIA) 100 MG tablet   Other Relevant Orders   POCT glycosylated hemoglobin (Hb A1C) (Completed)   Lipid panel  Comprehensive metabolic panel   Dyslipidemia    Continue Pravachol and a healthy diet      Relevant Medications   pravastatin (PRAVACHOL) 40 MG tablet   Other Relevant Orders   Lipid panel   Comprehensive metabolic panel    Other Visit Diagnoses    Need for vaccination       discussed vaccinations for diabetes standard of care        Drew Hernandez [facility]all[facility]

## 2016-12-03 NOTE — Assessment & Plan Note (Addendum)
Worsening a1c despite weight loss, diabetic diet and metformin Will try Januvia

## 2017-03-10 ENCOUNTER — Ambulatory Visit: Payer: Self-pay | Admitting: Family Medicine

## 2017-07-07 NOTE — Progress Notes (Signed)
Chief Complaint  Patient presents with  . Medication Refill    lisinopril, metformin, pravastin    HPI  Sciatica Patient reports that he missed his appointment He states that he is working 2 jobs so he did not get a chance to come back until now He reports that he came in due to body aches and pains like back pain and leg pains He was taking alleve He states that his pain locks up on him   Diabetes He denies polyuria, polydipsia, polyphagia This morning he was 267 at home He reports that his throat gets dry so he drinks water He drinks a lot of water to stay hydrated He reports that he has been spacing out his meds because the metformin give him diarrhea So his medications were able to last longer He takes metformin 500 xr bid  He states that he could not afford the Januvia.    Hypertension: Patient here for follow-up of elevated blood pressure. He is exercising at the gym lifting weights and is not adherent to low salt diet.  Blood pressure is not checked at home. Cardiac symptoms none. Patient denies chest pain, chest pressure/discomfort, claudication, dyspnea, exertional chest pressure/discomfort and fatigue.  Cardiovascular risk factors: diabetes mellitus, hypertension, male gender and obesity (BMI >= 30 kg/m2). Use of agents associated with hypertension: NSAIDS. History of target organ damage: none. BP Readings from Last 3 Encounters:  07/08/17 134/81  12/03/16 127/80  06/19/16 130/90   Lab Results  Component Value Date   CREATININE 0.77 07/08/2017     Past Medical History:  Diagnosis Date  . Diabetes mellitus without complication (HCC)    dx with DM 2 age 57, he was on insulin in the past and lost weight, was over 400 lbs.   . Hypertension    age 40 due to obesity    Current Outpatient Medications  Medication Sig Dispense Refill  . lisinopril (PRINIVIL,ZESTRIL) 20 MG tablet Take 1 tablet (20 mg total) by mouth daily. 90 tablet 1  . metFORMIN (GLUCOPHAGE XR)  500 MG 24 hr tablet Take one 1 tab at breakfast,  2 tabs at bedtime. 270 tablet 1  . pravastatin (PRAVACHOL) 40 MG tablet Take 1 tablet (40 mg total) by mouth daily. 90 tablet 1  . dapagliflozin propanediol (FARXIGA) 5 MG TABS tablet Take 5 mg by mouth daily. 30 tablet 1  . meloxicam (MOBIC) 15 MG tablet Take 1 tablet (15 mg total) by mouth daily. 30 tablet 0   No current facility-administered medications for this visit.     Allergies: No Known Allergies  No past surgical history on file.  Social History   Socioeconomic History  . Marital status: Single    Spouse name: None  . Number of children: None  . Years of education: None  . Highest education level: None  Social Needs  . Financial resource strain: None  . Food insecurity - worry: None  . Food insecurity - inability: None  . Transportation needs - medical: None  . Transportation needs - non-medical: None  Occupational History  . None  Tobacco Use  . Smoking status: Never Smoker  . Smokeless tobacco: Never Used  Substance and Sexual Activity  . Alcohol use: Yes    Alcohol/week: 0.0 oz  . Drug use: No  . Sexual activity: None  Other Topics Concern  . None  Social History Narrative  . None    Family History  Problem Relation Age of Onset  . Cancer Paternal Grandfather   .  Hypertension Paternal Grandfather      ROS Review of Systems See HPI Constitution: No fevers or chills No malaise No diaphoresis Skin: No rash or itching Eyes: no blurry vision, no double vision GU: no dysuria or hematuria Neuro: no dizziness or headaches  all others reviewed and negative   Objective: Vitals:   07/08/17 1558  BP: 134/81  Pulse: 90  Resp: 16  Temp: 98 F (36.7 C)  TempSrc: Oral  SpO2: 97%  Weight: 273 lb 6.4 oz (124 kg)  Height: 6' 2.09" (1.882 m)    Physical Exam  Constitutional: He is oriented to person, place, and time. He appears well-developed and well-nourished.  HENT:  Head: Normocephalic and  atraumatic.  Eyes: Conjunctivae and EOM are normal.  Neck: Normal range of motion. No thyromegaly present.  Cardiovascular: Normal rate, regular rhythm, normal heart sounds and intact distal pulses.  No murmur heard. Pulmonary/Chest: Effort normal and breath sounds normal. No stridor. No respiratory distress.  Abdominal: Soft. Bowel sounds are normal. He exhibits no distension and no mass. There is no tenderness. There is no guarding.  Musculoskeletal:       Lumbar back: He exhibits spasm. He exhibits normal range of motion, no tenderness, no bony tenderness, no swelling and no edema.  Straight leg raise positive at 60 degrees on the right  Neurological: He is alert and oriented to person, place, and time.  Skin: Skin is warm. Capillary refill takes less than 2 seconds.  Psychiatric: He has a normal mood and affect. His behavior is normal. Judgment and thought content normal.    Assessment and Plan Drew Hernandez was seen today for medication refill.  Diagnoses and all orders for this visit:  Uncontrolled type 2 diabetes mellitus with hyperglycemia (HCC)- pt only metformin Due to cost did not start [place] Will switch to Farxiga Pt to notify us if coupon card does not help with coverage copay -     Comprehensive metabolic panel -     TSH -     CBC -     Lipid panel  Type 2 diabetes mellitus with complication, without long-term current use of insulin (HCC) -     POCT glycosylated hemoglobin (Hb A1C)  Essential hypertension- will continue his lisinopril which is working for his bp Continue DASH diet -     Comprehensive metabolic panel -     lisinopril (PRINIVIL,ZESTRIL) 20 MG tablet; Take 1 tablet (20 mg total) by mouth daily.  Encounter for medication monitoring -     Comprehensive metabolic panel -     TSH -     CBC -     Lipid panel  Sciatica of right side- advised home stretches as well as meloxicam for pain  Type 2 diabetes mellitus without complication, without long-term  current use of insulin (HCC)- remains uncontrolled Set patient up with prescription program for Farxiga Continue metformin Discussed dietary modification -     lisinopril (PRINIVIL,ZESTRIL) 20 MG tablet; Take 1 tablet (20 mg total) by mouth daily.  Other orders -     Cancel: Flu Vaccine QUAD 36+ mos IM -     Cancel: Tdap vaccine greater than or equal to 7yo IM -     dapagliflozin propanediol (FARXIGA) 5 MG TABS tablet; Take 5 mg by mouth daily. -     meloxicam (MOBIC) 15 MG tablet; Take 1 tablet (15 mg total) by mouth daily.   A total of 42 minutes were spent face-to-face with the patient during this encounter  and over half of that time was spent on counseling and coordination of care. Time spent on diabetic education and the importance of compliance  Spent time teaching about Marcelline DeistFarxiga Also reviewed heart disease risk   Drew Tomberlin A Creta LevinStallings

## 2017-07-08 ENCOUNTER — Encounter: Payer: Self-pay | Admitting: Family Medicine

## 2017-07-08 ENCOUNTER — Other Ambulatory Visit: Payer: Self-pay

## 2017-07-08 ENCOUNTER — Ambulatory Visit: Payer: BC Managed Care – PPO | Admitting: Family Medicine

## 2017-07-08 VITALS — BP 134/81 | HR 90 | Temp 98.0°F | Resp 16 | Ht 74.09 in | Wt 273.4 lb

## 2017-07-08 DIAGNOSIS — Z5181 Encounter for therapeutic drug level monitoring: Secondary | ICD-10-CM

## 2017-07-08 DIAGNOSIS — E119 Type 2 diabetes mellitus without complications: Secondary | ICD-10-CM | POA: Diagnosis not present

## 2017-07-08 DIAGNOSIS — M5431 Sciatica, right side: Secondary | ICD-10-CM | POA: Diagnosis not present

## 2017-07-08 DIAGNOSIS — E1165 Type 2 diabetes mellitus with hyperglycemia: Secondary | ICD-10-CM | POA: Diagnosis not present

## 2017-07-08 DIAGNOSIS — I10 Essential (primary) hypertension: Secondary | ICD-10-CM

## 2017-07-08 DIAGNOSIS — E118 Type 2 diabetes mellitus with unspecified complications: Secondary | ICD-10-CM

## 2017-07-08 LAB — POCT GLYCOSYLATED HEMOGLOBIN (HGB A1C): HEMOGLOBIN A1C: 10.5

## 2017-07-08 MED ORDER — DAPAGLIFLOZIN PROPANEDIOL 5 MG PO TABS: 5.0000 mg | ORAL_TABLET | Freq: Every day | ORAL | 1 refills | Status: DC

## 2017-07-08 MED ORDER — LISINOPRIL 20 MG PO TABS: 20.0000 mg | ORAL_TABLET | Freq: Every day | ORAL | 1 refills | Status: DC

## 2017-07-08 MED ORDER — MELOXICAM 15 MG PO TABS
15.0000 mg | ORAL_TABLET | Freq: Every day | ORAL | 0 refills | Status: DC
Start: 2017-07-08 — End: 2018-01-10

## 2017-07-08 NOTE — Patient Instructions (Addendum)
   IF you received an x-ray today, you will receive an invoice from [facility]. Please contact [facility] at [phone] with questions or concerns regarding your invoice.   IF you received labwork today, you will receive an invoice from [facility]. Please contact [facility] at [phone] with questions or concerns regarding your invoice.   Our billing staff will not be able to assist you with questions regarding bills from these companies.  You will be contacted with the lab results as soon as they are available. The fastest way to get your results is to activate your My Chart account. Instructions are located on the last page of this paperwork. If you have not heard from us regarding the results in 2 weeks, please contact this office.     Sciatica Rehab Ask your health care provider which exercises are safe for you. Do exercises exactly as told by your health care provider and adjust them as directed. It is normal to feel mild stretching, pulling, tightness, or discomfort as you do these exercises, but you should stop right away if you feel sudden pain or your pain gets worse.Do not begin these exercises until told by your health care provider. Stretching and range of motion exercises These exercises warm up your muscles and joints and improve the movement and flexibility of your hips and your back. These exercises also help to relieve pain, numbness, and tingling. Exercise A: Sciatic nerve glide 1. Sit in a chair with your head facing down toward your chest. Place your hands behind your back. Let your shoulders slump forward. 2. Slowly straighten one of your knees while you tilt your head back as if you are looking toward the ceiling. Only straighten your leg as far as you can without making your symptoms worse. 3. Hold for __________ seconds. 4. Slowly return to the starting position. 5. Repeat with your other leg. Repeat __________ times. Complete this exercise  __________ times a day. Exercise B: Knee to chest with hip adduction and internal rotation  1. Lie on your back on a firm surface with both legs straight. 2. Bend one of your knees and move it up toward your chest until you feel a gentle stretch in your lower back and buttock. Then, move your knee toward the shoulder that is on the opposite side from your leg. ? Hold your leg in this position by holding onto the front of your knee. 3. Hold for __________ seconds. 4. Slowly return to the starting position. 5. Repeat with your other leg. Repeat __________ times. Complete this exercise __________ times a day. Exercise C: Prone extension on elbows  1. Lie on your abdomen on a firm surface. A bed may be too soft for this exercise. 2. Prop yourself up on your elbows. 3. Use your arms to help lift your chest up until you feel a gentle stretch in your abdomen and your lower back. ? This will place some of your body weight on your elbows. If this is uncomfortable, try stacking pillows under your chest. ? Your hips should stay down, against the surface that you are lying on. Keep your hip and back muscles relaxed. 4. Hold for __________ seconds. 5. Slowly relax your upper body and return to the starting position. Repeat __________ times. Complete this exercise __________ times a day. Strengthening exercises These exercises build strength and endurance in your back. Endurance is the ability to use your muscles for a long time, even after they get tired. Exercise D: Pelvic   tilt 1. Lie on your back on a firm surface. Bend your knees and keep your feet flat. 2. Tense your abdominal muscles. Tip your pelvis up toward the ceiling and flatten your lower back into the floor. ? To help with this exercise, you may place a small towel under your lower back and try to push your back into the towel. 3. Hold for __________ seconds. 4. Let your muscles relax completely before you repeat this exercise. Repeat  __________ times. Complete this exercise __________ times a day. Exercise E: Alternating arm and leg raises  1. Get on your hands and knees on a firm surface. If you are on a hard floor, you may want to use padding to cushion your knees, such as an exercise mat. 2. Line up your arms and legs. Your hands should be below your shoulders, and your knees should be below your hips. 3. Lift your left leg behind you. At the same time, raise your right arm and straighten it in front of you. ? Do not lift your leg higher than your hip. ? Do not lift your arm higher than your shoulder. ? Keep your abdominal and back muscles tight. ? Keep your hips facing the ground. ? Do not arch your back. ? Keep your balance carefully, and do not hold your breath. 4. Hold for __________ seconds. 5. Slowly return to the starting position and repeat with your right leg and your left arm. Repeat __________ times. Complete this exercise __________ times a day. Posture and body mechanics  Body mechanics refers to the movements and positions of your body while you do your daily activities. Posture is part of body mechanics. Good posture and healthy body mechanics can help to relieve stress in your body's tissues and joints. Good posture means that your spine is in its natural S-curve position (your spine is neutral), your shoulders are pulled back slightly, and your head is not tipped forward. The following are general guidelines for applying improved posture and body mechanics to your everyday activities. Standing   When standing, keep your spine neutral and your feet about hip-width apart. Keep a slight bend in your knees. Your ears, shoulders, and hips should line up.  When you do a task in which you stand in one place for a long time, place one foot up on a stable object that is 2-4 inches (5-10 cm) high, such as a footstool. This helps keep your spine neutral. Sitting   When sitting, keep your spine neutral and keep  your feet flat on the floor. Use a footrest, if necessary, and keep your thighs parallel to the floor. Avoid rounding your shoulders, and avoid tilting your head forward.  When working at a desk or a computer, keep your desk at a height where your hands are slightly lower than your elbows. Slide your chair under your desk so you are close enough to maintain good posture.  When working at a computer, place your monitor at a height where you are looking straight ahead and you do not have to tilt your head forward or downward to look at the screen. Resting   When lying down and resting, avoid positions that are most painful for you.  If you have pain with activities such as sitting, bending, stooping, or squatting (flexion-based activities), lie in a position in which your body does not bend very much. For example, avoid curling up on your side with your arms and knees near your chest (fetal position).    If you have pain with activities such as standing for a long time or reaching with your arms (extension-based activities), lie with your spine in a neutral position and bend your knees slightly. Try the following positions: ? Lying on your side with a pillow between your knees. ? Lying on your back with a pillow under your knees. Lifting   When lifting objects, keep your feet at least shoulder-width apart and tighten your abdominal muscles.  Bend your knees and hips and keep your spine neutral. It is important to lift using the strength of your legs, not your back. Do not lock your knees straight out.  Always ask for help to lift heavy or awkward objects. This information is not intended to replace advice given to you by your health care provider. Make sure you discuss any questions you have with your health care provider. Document Released: 05/18/2005 Document Revised: 01/23/2016 Document Reviewed: 02/01/2015 Elsevier Interactive Patient Education  2018 Elsevier Inc.  

## 2017-07-09 LAB — LIPID PANEL
CHOL/HDL RATIO: 6.4 ratio — AB (ref 0.0–5.0)
Cholesterol, Total: 219 mg/dL — ABNORMAL HIGH (ref 100–199)
HDL: 34 mg/dL — AB (ref >39)
LDL Calculated: 140 mg/dL — ABNORMAL HIGH (ref 0–99)
TRIGLYCERIDES: 223 mg/dL — AB (ref 0–149)
VLDL CHOLESTEROL CAL: 45 mg/dL — AB (ref 5–40)

## 2017-07-09 LAB — COMPREHENSIVE METABOLIC PANEL
ALBUMIN: 4.5 g/dL (ref 3.5–5.5)
ALK PHOS: 70 IU/L (ref 39–117)
ALT: 9 IU/L (ref 0–44)
AST: 10 IU/L (ref 0–40)
Albumin/Globulin Ratio: 1.4 (ref 1.2–2.2)
BILIRUBIN TOTAL: 0.3 mg/dL (ref 0.0–1.2)
BUN / CREAT RATIO: 10 (ref 9–20)
BUN: 8 mg/dL (ref 6–20)
CO2: 24 mmol/L (ref 20–29)
Calcium: 9.7 mg/dL (ref 8.7–10.2)
Chloride: 99 mmol/L (ref 96–106)
Creatinine, Ser: 0.77 mg/dL (ref 0.76–1.27)
GFR calc non Af Amer: 120 mL/min/{1.73_m2} (ref >59)
GFR, EST AFRICAN AMERICAN: 139 mL/min/{1.73_m2} (ref >59)
GLOBULIN, TOTAL: 3.3 g/dL (ref 1.5–4.5)
Glucose: 304 mg/dL — ABNORMAL HIGH (ref 65–99)
Potassium: 4.6 mmol/L (ref 3.5–5.2)
Sodium: 139 mmol/L (ref 134–144)
TOTAL PROTEIN: 7.8 g/dL (ref 6.0–8.5)

## 2017-07-09 LAB — CBC
HEMATOCRIT: 43.5 % (ref 37.5–51.0)
Hemoglobin: 14.1 g/dL (ref 13.0–17.7)
MCH: 26.5 pg — ABNORMAL LOW (ref 26.6–33.0)
MCHC: 32.4 g/dL (ref 31.5–35.7)
MCV: 82 fL (ref 79–97)
PLATELETS: 227 10*3/uL (ref 150–379)
RBC: 5.33 x10E6/uL (ref 4.14–5.80)
RDW: 13.4 % (ref 12.3–15.4)
WBC: 8.2 10*3/uL (ref 3.4–10.8)

## 2017-07-09 LAB — TSH: TSH: 0.686 u[IU]/mL (ref 0.450–4.500)

## 2017-07-26 ENCOUNTER — Ambulatory Visit: Payer: BC Managed Care – PPO | Admitting: Physician Assistant

## 2017-07-26 VITALS — BP 151/88 | HR 100 | Temp 97.9°F | Resp 16 | Ht 74.0 in | Wt 264.0 lb

## 2017-07-26 DIAGNOSIS — B9789 Other viral agents as the cause of diseases classified elsewhere: Secondary | ICD-10-CM | POA: Diagnosis not present

## 2017-07-26 DIAGNOSIS — J069 Acute upper respiratory infection, unspecified: Secondary | ICD-10-CM

## 2017-07-26 MED ORDER — IPRATROPIUM BROMIDE 0.03 % NA SOLN: 2.0000 | Freq: Two times a day (BID) | NASAL | 0 refills | Status: DC

## 2017-07-26 MED ORDER — GUAIFENESIN ER 1200 MG PO TB12: 1.0000 | ORAL_TABLET | Freq: Two times a day (BID) | ORAL | 1 refills | Status: DC | PRN

## 2017-07-26 MED ORDER — BENZONATATE 100 MG PO CAPS: 100.0000 mg | ORAL_CAPSULE | Freq: Three times a day (TID) | ORAL | 0 refills | Status: DC | PRN

## 2017-07-26 NOTE — Progress Notes (Signed)
PRIMARY CARE AT [facility] [address] [place] [address] [phone]  Date:  07/26/2017   Name:  Mansel Strother   DOB:  23-Apr-1986   MRN:  [id number]  PCP:  Doristine Bosworth, MD    History of Present Illness:  Art Levan is a 32 y.o. male patient who presents to PCP with  Chief Complaint  Patient presents with  . Cough    dry/ x wed  . Generalized Body Aches    x 1 wk  . Sinusitis    x 1wk     5 days of generalized body aches, feels weak.  Nasal congestion, headaches, and runny nose.  no ear discomfort.  Coughing up some phlegm initially, however this has resolved.  He feels that he ultimately is getting better.  No facial pain.  No thickened mucus.  Cough productive at times.  He has taken cough medicine, aleve.   He did not flu shot this year.   Patient Active Problem List   Diagnosis Date Noted  . Class 2 obesity due to excess calories with serious comorbidity and body mass index (BMI) of 35.0 to 35.9 in adult 12/03/2016  . Dyslipidemia 12/03/2016  . Spasm of back muscles 03/14/2015  . Essential hypertension 01/26/2015  . Type 2 diabetes mellitus without complication (HCC) 01/26/2015    Past Medical History:  Diagnosis Date  . Diabetes mellitus without complication (HCC)    dx with DM 2 age 42, he was on insulin in the past and lost weight, was over 400 lbs.   . Hypertension    age 56 due to obesity    No past surgical history on file.  Social History   Tobacco Use  . Smoking status: Never Smoker  . Smokeless tobacco: Never Used  Substance Use Topics  . Alcohol use: Yes    Alcohol/week: 0.0 oz  . Drug use: No    Family History  Problem Relation Age of Onset  . Cancer Paternal Grandfather   . Hypertension Paternal Grandfather     No Known Allergies  Medication list has been reviewed and updated.  Current Outpatient Medications on File Prior to Visit  Medication Sig Dispense Refill  . dapagliflozin propanediol (FARXIGA) 5 MG TABS tablet Take 5  mg by mouth daily. 30 tablet 1  . lisinopril (PRINIVIL,ZESTRIL) 20 MG tablet Take 1 tablet (20 mg total) by mouth daily. 90 tablet 1  . meloxicam (MOBIC) 15 MG tablet Take 1 tablet (15 mg total) by mouth daily. 30 tablet 0  . metFORMIN (GLUCOPHAGE XR) 500 MG 24 hr tablet Take one 1 tab at breakfast,  2 tabs at bedtime. 270 tablet 1  . pravastatin (PRAVACHOL) 40 MG tablet Take 1 tablet (40 mg total) by mouth daily. 90 tablet 1   No current facility-administered medications on file prior to visit.     ROS ROS otherwise unremarkable unless listed above.  Physical Examination: There were no vitals taken for this visit. Ideal Body Weight:    Physical Exam  Constitutional: He is oriented to person, place, and time. He appears well-developed and well-nourished. No distress.  HENT:  Head: Normocephalic and atraumatic.  Right Ear: Tympanic membrane, external ear and ear canal normal.  Left Ear: Tympanic membrane, external ear and ear canal normal.  Nose: Mucosal edema and rhinorrhea present. Right sinus exhibits no maxillary sinus tenderness and no frontal sinus tenderness. Left sinus exhibits no maxillary sinus tenderness and no frontal sinus tenderness.  Mouth/Throat: No uvula swelling.  No oropharyngeal exudate, posterior oropharyngeal edema or posterior oropharyngeal erythema.  Eyes: Conjunctivae, EOM and lids are normal. Pupils are equal, round, and reactive to light. Right eye exhibits normal extraocular motion. Left eye exhibits normal extraocular motion.  Neck: Trachea normal and full passive range of motion without pain. No edema and no erythema present.  Cardiovascular: Normal rate.  Pulmonary/Chest: Effort normal. No respiratory distress. He has no decreased breath sounds. He has no wheezes. He has no rhonchi.  Neurological: He is alert and oriented to person, place, and time.  Skin: Skin is warm and dry. He is not diaphoretic.  Psychiatric: He has a normal mood and affect. His  behavior is normal.     Assessment and Plan: Clide CliffRicky Linsley is a 32 y.o. male who is here today for cc of  Chief Complaint  Patient presents with  . Cough    dry/ x wed  . Generalized Body Aches    x 1 wk  . Sinusitis    x 1wk   Viral URI with cough - Plan: ipratropium (ATROVENT) 0.03 % nasal spray, benzonatate (TESSALON) 100 MG capsule, Guaifenesin (MUCINEX MAXIMUM STRENGTH) 1200 MG TB12  Trena PlattStephanie Scarlett Portlock, PA-C [facility] 3/1/20194:14 PM

## 2017-07-26 NOTE — Patient Instructions (Addendum)
  Please make sure you are hydrating well with 64 oz of water per day.    Upper Respiratory Infection, Adult Most upper respiratory infections (URIs) are caused by a virus. A URI affects the nose, throat, and upper air passages. The most common type of URI is often called "the common cold." Follow these instructions at home:  Take medicines only as told by your doctor.  Gargle warm saltwater or take cough drops to comfort your throat as told by your doctor.  Use a warm mist humidifier or inhale steam from a shower to increase air moisture. This may make it easier to breathe.  Drink enough fluid to keep your pee (urine) clear or pale yellow.  Eat soups and other clear broths.  Have a healthy diet.  Rest as needed.  Go back to work when your fever is gone or your doctor says it is okay. ? You may need to stay home longer to avoid giving your URI to others. ? You can also wear a face mask and wash your hands often to prevent spread of the virus.  Use your inhaler more if you have asthma.  Do not use any tobacco products, including cigarettes, chewing tobacco, or electronic cigarettes. If you need help quitting, ask your doctor. Contact a doctor if:  You are getting worse, not better.  Your symptoms are not helped by medicine.  You have chills.  You are getting more short of breath.  You have brown or red mucus.  You have yellow or brown discharge from your nose.  You have pain in your face, especially when you bend forward.  You have a fever.  You have puffy (swollen) neck glands.  You have pain while swallowing.  You have white areas in the back of your throat. Get help right away if:  You have very bad or constant: ? Headache. ? Ear pain. ? Pain in your forehead, behind your eyes, and over your cheekbones (sinus pain). ? Chest pain.  You have long-lasting (chronic) lung disease and any of the following: ? Wheezing. ? Long-lasting cough. ? Coughing up  blood. ? A change in your usual mucus.  You have a stiff neck.  You have changes in your: ? Vision. ? Hearing. ? Thinking. ? Mood. This information is not intended to replace advice given to you by your health care provider. Make sure you discuss any questions you have with your health care provider. Document Released: 11/04/2007 Document Revised: 01/19/2016 Document Reviewed: 08/23/2013 Elsevier Interactive Patient Education  2018 [employer]    IF you received an x-ray today, you will receive an invoice from [facility]. Please contact [facility] at [phone] with questions or concerns regarding your invoice.   IF you received labwork today, you will receive an invoice from [facility]. Please contact [facility] at [phone] with questions or concerns regarding your invoice.   Our billing staff will not be able to assist you with questions regarding bills from these companies.  You will be contacted with the lab results as soon as they are available. The fastest way to get your results is to activate your My Chart account. Instructions are located on the last page of this paperwork. If you have not heard from us regarding the results in 2 weeks, please contact this office.

## 2017-07-30 ENCOUNTER — Encounter: Payer: Self-pay | Admitting: Physician Assistant

## 2017-08-10 NOTE — Progress Notes (Deleted)
  No chief complaint on file.   HPI  4 review of systems  Past Medical History:  Diagnosis Date  . Diabetes mellitus without complication (HCC)    dx with DM 2 age 32, he was on insulin in the past and lost weight, was over 400 lbs.   . Hypertension    age 89 due to obesity    Current Outpatient Medications  Medication Sig Dispense Refill  . benzonatate (TESSALON) 100 MG capsule Take 1-2 capsules (100-200 mg total) by mouth 3 (three) times daily as needed for cough. 40 capsule 0  . dapagliflozin propanediol (FARXIGA) 5 MG TABS tablet Take 5 mg by mouth daily. 30 tablet 1  . Guaifenesin (MUCINEX MAXIMUM STRENGTH) 1200 MG TB12 Take 1 tablet (1,200 mg total) by mouth every 12 (twelve) hours as needed. 14 tablet 1  . ipratropium (ATROVENT) 0.03 % nasal spray Place 2 sprays into both nostrils 2 (two) times daily. 30 mL 0  . lisinopril (PRINIVIL,ZESTRIL) 20 MG tablet Take 1 tablet (20 mg total) by mouth daily. 90 tablet 1  . meloxicam (MOBIC) 15 MG tablet Take 1 tablet (15 mg total) by mouth daily. 30 tablet 0  . metFORMIN (GLUCOPHAGE XR) 500 MG 24 hr tablet Take one 1 tab at breakfast,  2 tabs at bedtime. 270 tablet 1  . pravastatin (PRAVACHOL) 40 MG tablet Take 1 tablet (40 mg total) by mouth daily. 90 tablet 1   No current facility-administered medications for this visit.     Allergies: No Known Allergies  No past surgical history on file.  Social History   Socioeconomic History  . Marital status: Single    Spouse name: Not on file  . Number of children: Not on file  . Years of education: Not on file  . Highest education level: Not on file  Social Needs  . Financial resource strain: Not on file  . Food insecurity - worry: Not on file  . Food insecurity - inability: Not on file  . Transportation needs - medical: Not on file  . Transportation needs - non-medical: Not on file  Occupational History  . Not on file  Tobacco Use  . Smoking status: Never Smoker  . Smokeless  tobacco: Never Used  Substance and Sexual Activity  . Alcohol use: Yes    Alcohol/week: 0.0 oz  . Drug use: No  . Sexual activity: Not on file  Other Topics Concern  . Not on file  Social History Narrative  . Not on file    Family History  Problem Relation Age of Onset  . Cancer Paternal Grandfather   . Hypertension Paternal Grandfather      ROS Review of Systems See HPI Constitution: No fevers or chills No malaise No diaphoresis Skin: No rash or itching Eyes: no blurry vision, no double vision GU: no dysuria or hematuria Neuro: no dizziness or headaches * all others reviewed and negative   Objective: There were no vitals filed for this visit.  Physical Exam  Assessment and Plan There are no diagnoses linked to this encounter.   Delores P PPL Corporationaddy

## 2017-08-11 ENCOUNTER — Ambulatory Visit: Payer: Self-pay | Admitting: Family Medicine

## 2017-09-06 ENCOUNTER — Ambulatory Visit: Payer: Self-pay | Admitting: Family Medicine

## 2017-09-06 ENCOUNTER — Telehealth: Payer: Self-pay | Admitting: Family Medicine

## 2017-09-06 NOTE — Telephone Encounter (Signed)
Left a message for pt to call and reschedule appt

## 2017-09-08 ENCOUNTER — Encounter: Payer: Self-pay | Admitting: Physician Assistant

## 2017-10-06 ENCOUNTER — Other Ambulatory Visit: Payer: Self-pay | Admitting: Family Medicine

## 2017-10-06 DIAGNOSIS — I10 Essential (primary) hypertension: Secondary | ICD-10-CM

## 2017-10-06 DIAGNOSIS — E119 Type 2 diabetes mellitus without complications: Secondary | ICD-10-CM

## 2018-01-10 ENCOUNTER — Ambulatory Visit (INDEPENDENT_AMBULATORY_CARE_PROVIDER_SITE_OTHER): Payer: BC Managed Care – PPO

## 2018-01-10 ENCOUNTER — Ambulatory Visit: Payer: BC Managed Care – PPO | Admitting: Family Medicine

## 2018-01-10 ENCOUNTER — Encounter: Payer: Self-pay | Admitting: Family Medicine

## 2018-01-10 ENCOUNTER — Other Ambulatory Visit: Payer: Self-pay

## 2018-01-10 VITALS — BP 136/80 | HR 107 | Temp 98.3°F | Resp 18 | Ht 74.0 in | Wt 268.4 lb

## 2018-01-10 DIAGNOSIS — M5431 Sciatica, right side: Secondary | ICD-10-CM

## 2018-01-10 DIAGNOSIS — M5442 Lumbago with sciatica, left side: Secondary | ICD-10-CM

## 2018-01-10 DIAGNOSIS — M5432 Sciatica, left side: Secondary | ICD-10-CM

## 2018-01-10 DIAGNOSIS — E119 Type 2 diabetes mellitus without complications: Secondary | ICD-10-CM

## 2018-01-10 DIAGNOSIS — I10 Essential (primary) hypertension: Secondary | ICD-10-CM

## 2018-01-10 DIAGNOSIS — G8929 Other chronic pain: Secondary | ICD-10-CM

## 2018-01-10 DIAGNOSIS — M5441 Lumbago with sciatica, right side: Secondary | ICD-10-CM

## 2018-01-10 DIAGNOSIS — E785 Hyperlipidemia, unspecified: Secondary | ICD-10-CM

## 2018-01-10 DIAGNOSIS — E1165 Type 2 diabetes mellitus with hyperglycemia: Secondary | ICD-10-CM

## 2018-01-10 LAB — POCT GLYCOSYLATED HEMOGLOBIN (HGB A1C): HEMOGLOBIN A1C: 12.5 % — AB (ref 4.0–5.6)

## 2018-01-10 MED ORDER — DAPAGLIFLOZIN PROPANEDIOL 5 MG PO TABS: 5.0000 mg | ORAL_TABLET | Freq: Every day | ORAL | 1 refills | Status: DC

## 2018-01-10 MED ORDER — LISINOPRIL 5 MG PO TABS: 5.0000 mg | ORAL_TABLET | Freq: Every day | ORAL | 3 refills | Status: DC

## 2018-01-10 MED ORDER — CYCLOBENZAPRINE HCL 10 MG PO TABS: 10.0000 mg | ORAL_TABLET | Freq: Three times a day (TID) | ORAL | 0 refills | Status: DC | PRN

## 2018-01-10 MED ORDER — METFORMIN HCL ER 500 MG PO TB24: ORAL_TABLET | ORAL | 1 refills | Status: DC

## 2018-01-10 NOTE — Progress Notes (Signed)
Chief Complaint  Patient presents with  . Back Pain    follow up     HPI   Diabetes Mellitus - Uncontrolled Patient was last seen for his diabetes mellitus on 07/08/2017 He reports that he has  Not been on the farxiga or lisinopril because there were no refills and he did not reschedule the April appointment He is only on metformin and pravastatin He became a Ambulance personpescatarian and had a very healthy diet  Lab Results  Component Value Date   HGBA1C 12.5 (A) 01/10/2018   Low back pain He also has back pain when he bends and causes him to limp The back pain is relieved with some pain meds that he takes otc He has difficulty moving his left leg He has a history of sciatica on the right side He stats that he gets locking in the left leg and he has to hold the back of the thigh where the pain is  Obesity He has a history of obesity He reports that he is not able to exercise due to his low back pain He eats a healthy diet Wt Readings from Last 3 Encounters:  01/10/18 268 lb 6.4 oz (121.7 kg)  07/26/17 264 lb (119.7 kg)  07/08/17 273 lb 6.4 oz (124 kg)   Hypertension He is not taking his lisinopril 20mg  He denies chest pain, palpitations or shortness of breath He has cut out fatty foods and avoided salty foods He is not able to exercise due to his back pain  Lab Results  Component Value Date   CREATININE 0.77 07/08/2017   BP Readings from Last 3 Encounters:  01/10/18 136/80  07/26/17 (!) 151/88  07/08/17 134/81    Past Medical History:  Diagnosis Date  . Diabetes mellitus without complication (HCC)    dx with DM 2 age 32, he was on insulin in the past and lost weight, was over 400 lbs.   . Hypertension    age 32 due to obesity    Current Outpatient Medications  Medication Sig Dispense Refill  . metFORMIN (GLUCOPHAGE-XR) 500 MG 24 hr tablet TAKE 1 TABLET BY MOUTH AT BREAKFAST AND TAKE 2 TABLETS BY MOUTH AT BEDTIME 270 tablet 1  . pravastatin (PRAVACHOL) 40 MG tablet TAKE  1 TABLET BY MOUTH EVERY DAY 90 tablet 1  . cyclobenzaprine (FLEXERIL) 10 MG tablet Take 1 tablet (10 mg total) by mouth 3 (three) times daily as needed for muscle spasms. 90 tablet 0  . dapagliflozin propanediol (FARXIGA) 5 MG TABS tablet Take 5 mg by mouth daily. 30 tablet 1  . lisinopril (PRINIVIL,ZESTRIL) 5 MG tablet Take 1 tablet (5 mg total) by mouth daily. 90 tablet 3   No current facility-administered medications for this visit.     Allergies: No Known Allergies  No past surgical history on file.  Social History   Socioeconomic History  . Marital status: Single    Spouse name: Not on file  . Number of children: Not on file  . Years of education: Not on file  . Highest education level: Not on file  Occupational History  . Not on file  Social Needs  . Financial resource strain: Not on file  . Food insecurity:    Worry: Not on file    Inability: Not on file  . Transportation needs:    Medical: Not on file    Non-medical: Not on file  Tobacco Use  . Smoking status: Never Smoker  . Smokeless tobacco: Never Used  Substance and Sexual Activity  . Alcohol use: Yes    Alcohol/week: 0.0 standard drinks  . Drug use: No  . Sexual activity: Not on file  Lifestyle  . Physical activity:    Days per week: Not on file    Minutes per session: Not on file  . Stress: Not on file  Relationships  . Social connections:    Talks on phone: Not on file    Gets together: Not on file    Attends religious service: Not on file    Active member of club or organization: Not on file    Attends meetings of clubs or organizations: Not on file    Relationship status: Not on file  Other Topics Concern  . Not on file  Social History Narrative  . Not on file    Family History  Problem Relation Age of Onset  . Cancer Paternal Grandfather   . Hypertension Paternal Grandfather      ROS Review of Systems See HPI Constitution: No fevers or chills No malaise No diaphoresis Skin: No  rash or itching Eyes: no blurry vision, no double vision GU: no dysuria or hematuria Neuro: no dizziness or headaches all others reviewed and negative   Objective: Vitals:   01/10/18 0903  BP: 136/80  Pulse: (!) 107  Resp: 18  Temp: 98.3 F (36.8 C)  TempSrc: Oral  SpO2: 96%  Weight: 268 lb 6.4 oz (121.7 kg)  Height: 6\' 2"  (1.88 m)    Physical Exam  Constitutional: He is oriented to person, place, and time. He appears well-developed and well-nourished.  HENT:  Head: Normocephalic and atraumatic.  Eyes: Conjunctivae and EOM are normal.  Cardiovascular: Normal rate, regular rhythm and normal heart sounds.  No murmur heard. Pulmonary/Chest: Effort normal and breath sounds normal. No stridor. No respiratory distress. He has no wheezes.  Neurological: He is alert and oriented to person, place, and time.  Skin: Skin is warm. Capillary refill takes less than 2 seconds.  Psychiatric: He has a normal mood and affect. His behavior is normal. Judgment and thought content normal.   Lumbar Radiculopathy Exam Back exam: full range of motion, no tenderness, palpable spasm or pain on motion. Straight-leg raise: Positive bilaterally Reflexes:       Right leg: 2+ at knees bilaterally           Left leg: 2+ at knees bilaterally Strength: normal and equal bilaterally  Sensory exam: normal in both lower extremities.  No obvious pain with hip motion or log rolling of leg.  CLINICAL DATA:  Chronic low back pain at L1-2  EXAM: LUMBAR SPINE - 2-3 VIEW  COMPARISON:  03/14/2015  FINDINGS: Five lumbar type vertebral bodies.  Negative for disc narrowing or visible facet spurring. Normal alignment.  IMPRESSION: Negative lumbar spine series.   Electronically Signed   By: Marnee Spring M.D.   On: 01/10/2018 09:49    Assessment and Plan Drew Hernandez was seen today for back pain.  Diagnoses and all orders for this visit:  Uncontrolled type 2 diabetes mellitus with hyperglycemia  (HCC)- discussed mechanism of disease as pt is non-compliant Will plan to resume farxiga And recheck a1c in 2 months -     Lipid panel -     POCT glycosylated hemoglobin (Hb A1C) -     Comprehensive metabolic panel -     dapagliflozin propanediol (FARXIGA) 5 MG TABS tablet; Take 5 mg by mouth daily. -     lisinopril (PRINIVIL,ZESTRIL) 5 MG  tablet; Take 1 tablet (5 mg total) by mouth daily.  Essential hypertension- controlled with diet but will plan for lisinopril for renal protection -     lisinopril (PRINIVIL,ZESTRIL) 5 MG tablet; Take 1 tablet (5 mg total) by mouth daily.  Dyslipidemia- previously very elevated  -     Lipid panel -     Comprehensive metabolic panel  Bilateral sciatica- discussed that this is likely due to muscle tightness and worsened by his diabetes -     Ambulatory referral to Physical Therapy -     cyclobenzaprine (FLEXERIL) 10 MG tablet; Take 1 tablet (10 mg total) by mouth 3 (three) times daily as needed for muscle spasms. -     DG Lumbar Spine 2-3 Views; Future  Chronic midline low back pain with bilateral sciatica- will rule out bone disease with xray -     DG Lumbar Spine 2-3 Views; Future     Drew Hernandez A Creta LevinStallings

## 2018-01-10 NOTE — Patient Instructions (Signed)
     IF you received an x-ray today, you will receive an invoice from [facility]. Please contact [facility] at [phone] with questions or concerns regarding your invoice.   IF you received labwork today, you will receive an invoice from [facility]. Please contact [facility] at [phone] with questions or concerns regarding your invoice.   Our billing staff will not be able to assist you with questions regarding bills from these companies.  You will be contacted with the lab results as soon as they are available. The fastest way to get your results is to activate your My Chart account. Instructions are located on the last page of this paperwork. If you have not heard from us regarding the results in 2 weeks, please contact this office.     

## 2018-01-11 ENCOUNTER — Ambulatory Visit: Payer: BC Managed Care – PPO | Admitting: Family Medicine

## 2018-01-11 ENCOUNTER — Other Ambulatory Visit: Payer: Self-pay | Admitting: Family Medicine

## 2018-01-11 DIAGNOSIS — E785 Hyperlipidemia, unspecified: Secondary | ICD-10-CM

## 2018-01-11 LAB — COMPREHENSIVE METABOLIC PANEL
A/G RATIO: 1.3 (ref 1.2–2.2)
ALBUMIN: 4.6 g/dL (ref 3.5–5.5)
ALT: 12 IU/L (ref 0–44)
AST: 12 IU/L (ref 0–40)
Alkaline Phosphatase: 68 IU/L (ref 39–117)
BUN / CREAT RATIO: 15 (ref 9–20)
BUN: 13 mg/dL (ref 6–20)
Bilirubin Total: 0.3 mg/dL (ref 0.0–1.2)
CALCIUM: 9.9 mg/dL (ref 8.7–10.2)
CHLORIDE: 97 mmol/L (ref 96–106)
CO2: 22 mmol/L (ref 20–29)
Creatinine, Ser: 0.85 mg/dL (ref 0.76–1.27)
GFR, EST AFRICAN AMERICAN: 132 mL/min/{1.73_m2} (ref >59)
GFR, EST NON AFRICAN AMERICAN: 114 mL/min/{1.73_m2} (ref >59)
GLOBULIN, TOTAL: 3.6 g/dL (ref 1.5–4.5)
Glucose: 308 mg/dL — ABNORMAL HIGH (ref 65–99)
POTASSIUM: 4.7 mmol/L (ref 3.5–5.2)
SODIUM: 136 mmol/L (ref 134–144)
Total Protein: 8.2 g/dL (ref 6.0–8.5)

## 2018-01-11 LAB — LIPID PANEL
CHOL/HDL RATIO: 7.9 ratio — AB (ref 0.0–5.0)
Cholesterol, Total: 253 mg/dL — ABNORMAL HIGH (ref 100–199)
HDL: 32 mg/dL — ABNORMAL LOW (ref >39)
LDL Calculated: 188 mg/dL — ABNORMAL HIGH (ref 0–99)
Triglycerides: 167 mg/dL — ABNORMAL HIGH (ref 0–149)
VLDL CHOLESTEROL CAL: 33 mg/dL (ref 5–40)

## 2018-01-11 MED ORDER — ATORVASTATIN CALCIUM 40 MG PO TABS: 40.0000 mg | ORAL_TABLET | Freq: Every day | ORAL | 3 refills | Status: DC

## 2018-01-11 NOTE — Addendum Note (Signed)
Addended by: Collie SiadSTALLINGS, Kramer Hanrahan A on: 01/11/2018 08:58 AM   Modules accepted: Orders

## 2018-06-22 ENCOUNTER — Ambulatory Visit: Payer: Self-pay | Admitting: *Deleted

## 2018-06-22 NOTE — Telephone Encounter (Signed)
FYI

## 2018-06-22 NOTE — Telephone Encounter (Signed)
Returned call to pt after MyChart message with complaints of dizziness. Pt states he is just getting over a little fever for about a day and half ago and the only other symptoms he has experienced is fever and weakness. Pt states that the dizziness does come and go. Pt states that he does take BP medications but has not been taking them regularly. Pt states when he did take his BP medications a couple of days ago he felt that the dizziness worsened. Pt states he does not have a BP monitor at home. No appt availability for the pt to be seen in the office within the next 24 hours. Pt advised to seek treatment in the Urgent Care for current symptoms. Pt verbalized understanding.   Reason for Disposition . Taking a medicine that could cause dizziness (e.g., blood pressure medications, diuretics)  Answer Assessment - Initial Assessment Questions 1. DESCRIPTION: "Describe your dizziness."     No strong but strong enough to know that i'm off balance 2. LIGHTHEADED: "Do you feel lightheaded?" (e.g., somewhat faint, woozy, weak upon standing)     Yes lightheaded with standing  and sitting  3. VERTIGO: "Do you feel like either you or the room is spinning or tilting?" (i.e. vertigo)     No 4. SEVERITY: "How bad is it?"  "Do you feel like you are going to faint?" "Can you stand and walk?"   - MILD - walking normally   - MODERATE - interferes with normal activities (e.g., work, school)    - SEVERE - unable to stand, requires support to walk, feels like passing out now.      mild 5. ONSET:  "When did the dizziness begin?"     A day and half  6. AGGRAVATING FACTORS: "Does anything make it worse?" (e.g., standing, change in head position)     No 7. CAUSE: "What do you think is causing the dizziness?"     unknown 8. RECURRENT SYMPTOM: "Have you had dizziness before?" If so, ask: "When was the last time?" "What happened that time?"     No 10. OTHER SYMPTOMS: "Do you have any other symptoms?" (e.g., fever,  chest pain, vomiting, diarrhea, bleeding)       Fever and body weakness  Protocols used: DIZZINESS [facility]

## 2018-07-17 NOTE — Telephone Encounter (Signed)
Please call Drew Hernandez and add him to my schedule on the same day so I can get to the bottom of his blood pressure issues and dizziness. Thank you.

## 2018-07-18 ENCOUNTER — Telehealth: Payer: Self-pay | Admitting: Family Medicine

## 2018-07-18 NOTE — Telephone Encounter (Signed)
Tried to call pt per Dr. Creta Levin. A male was on the VM greeting so I did not leave a message. I did send a MyChart message. If/When pt calls back, please call [place] and ask for Gaston Islam or [place] as we will have to use a sameday appt.   Thank you!

## 2018-12-27 ENCOUNTER — Other Ambulatory Visit: Payer: Self-pay

## 2018-12-27 ENCOUNTER — Ambulatory Visit (INDEPENDENT_AMBULATORY_CARE_PROVIDER_SITE_OTHER): Payer: BC Managed Care – PPO | Admitting: Family Medicine

## 2018-12-27 VITALS — BP 156/98 | HR 87 | Temp 98.1°F | Ht 75.0 in | Wt 265.4 lb

## 2018-12-27 DIAGNOSIS — M5431 Sciatica, right side: Secondary | ICD-10-CM

## 2018-12-27 DIAGNOSIS — I10 Essential (primary) hypertension: Secondary | ICD-10-CM

## 2018-12-27 DIAGNOSIS — M5432 Sciatica, left side: Secondary | ICD-10-CM

## 2018-12-27 DIAGNOSIS — E1165 Type 2 diabetes mellitus with hyperglycemia: Secondary | ICD-10-CM | POA: Diagnosis not present

## 2018-12-27 DIAGNOSIS — E785 Hyperlipidemia, unspecified: Secondary | ICD-10-CM | POA: Diagnosis not present

## 2018-12-27 LAB — POC MICROSCOPIC URINALYSIS (UMFC): Mucus: ABSENT

## 2018-12-27 LAB — HEMOGLOBIN A1C
Est. average glucose Bld gHb Est-mCnc: 303 mg/dL
Hgb A1c MFr Bld: 12.2 % — ABNORMAL HIGH (ref 4.8–5.6)

## 2018-12-27 LAB — GLUCOSE, POCT (MANUAL RESULT ENTRY): POC Glucose: 340 mg/dl — AB (ref 70–99)

## 2018-12-27 MED ORDER — DAPAGLIFLOZIN PROPANEDIOL 5 MG PO TABS: 5.0000 mg | ORAL_TABLET | Freq: Every day | ORAL | 1 refills | Status: DC

## 2018-12-27 MED ORDER — LISINOPRIL 10 MG PO TABS: 10.0000 mg | ORAL_TABLET | Freq: Every day | ORAL | 0 refills | Status: DC

## 2018-12-27 MED ORDER — CYCLOBENZAPRINE HCL 5 MG PO TABS: 10.0000 mg | ORAL_TABLET | Freq: Every day | ORAL | 5 refills | Status: DC

## 2018-12-27 MED ORDER — ATORVASTATIN CALCIUM 40 MG PO TABS: 40.0000 mg | ORAL_TABLET | Freq: Every day | ORAL | 0 refills | Status: DC

## 2018-12-27 NOTE — Patient Instructions (Addendum)
Increase dose of lisinopril to 10mg  daily Start taking Farxiga daily Check glucose daily-fasting and if any concerning symptoms Restart Lipitor for cholesterol next week Use flexeril 1/2-1 at night-have at least 8 hours in bed to allow for drowsiness-DO NOT DRIVE after taking medication Follow up in 2 weeks-bring any glucose readings

## 2018-12-27 NOTE — Progress Notes (Signed)
Acute Office Visit  Subjective:    Patient ID: Drew Hernandez, male    DOB: 1986-01-04, 33 y.o.   MRN: [id number]  Chief Complaint  Patient presents with  . Dizziness    intermittant  . Insomnia    times 2 months  . Hypertension    HPI Patient is in today for DM/HTN-pt has not taken medication recently-out of medication Dizziness intermittently with positional change. Pt has been out of medication due to lack of insurance and wants to have labwork and medication refills. No headaches. No visual changes.   Past Medical History:  Diagnosis Date  . Diabetes mellitus without complication (HCC)    dx with DM 2 age 33, he was on insulin in the past and lost weight, was over 400 lbs.   . Hypertension    age 33 due to obesity     Family History  Problem Relation Age of Onset  . Cancer Paternal Grandfather   . Hypertension Paternal Grandfather     Social History   Socioeconomic History  . Marital status: Single    Spouse name: Not on file  . Number of children: Not on file  . Years of education: Not on file  . Highest education level: Not on file  Occupational History  . Not on file  Social Needs  . Financial resource strain: Not on file  . Food insecurity    Worry: Not on file    Inability: Not on file  . Transportation needs    Medical: Not on file    Non-medical: Not on file  Tobacco Use  . Smoking status: Never Smoker  . Smokeless tobacco: Never Used  Substance and Sexual Activity  . Alcohol use: Yes    Alcohol/week: 0.0 standard drinks  . Drug use: No  . Sexual activity: Not on file  Lifestyle  . Physical activity    Days per week: Not on file    Minutes per session: Not on file  . Stress: Not on file  Relationships  . Social Musicianconnections    Talks on phone: Not on file    Gets together: Not on file    Attends religious service: Not on file    Active member of club or organization: Not on file    Attends meetings of clubs or organizations: Not on  file    Relationship status: Not on file  . Intimate partner violence    Fear of current or ex partner: Not on file    Emotionally abused: Not on file    Physically abused: Not on file    Forced sexual activity: Not on file  Other Topics Concern  . Not on file  Social History Narrative  . Not on file    Outpatient Medications Prior to Visit  Medication Sig Dispense Refill  . atorvastatin (LIPITOR) 40 MG tablet Take 1 tablet (40 mg total) by mouth daily. 90 tablet 3  . cyclobenzaprine (FLEXERIL) 10 MG tablet Take 1 tablet (10 mg total) by mouth 3 (three) times daily as needed for muscle spasms. 90 tablet 0  . dapagliflozin propanediol (FARXIGA) 5 MG TABS tablet Take 5 mg by mouth daily. 30 tablet 1  . lisinopril (PRINIVIL,ZESTRIL) 5 MG tablet Take 1 tablet (5 mg total) by mouth daily. 90 tablet 3  . metFORMIN (GLUCOPHAGE-XR) 500 MG 24 hr tablet TAKE 1 TABLET BY MOUTH AT BREAKFAST AND TAKE 2 TABLETS BY MOUTH AT BEDTIME 270 tablet 1   No facility-administered medications prior  to visit.     No Known Allergies  Review of Systems  Constitutional: Negative for chills and fever.  Eyes: Negative for blurred vision.  Respiratory: Negative for cough.   Cardiovascular: Negative for chest pain, palpitations and leg swelling.  Gastrointestinal: Negative for abdominal pain, constipation, diarrhea and heartburn.  Neurological: Positive for dizziness. Negative for headaches.       Objective:    Physical Exam  Constitutional: He is oriented to person, place, and time. He appears well-developed and well-nourished. No distress.  HENT:  Head: Normocephalic and atraumatic.  Left Ear: External ear normal.  Eyes: Conjunctivae are normal.  Neck: Neck supple.  Cardiovascular: Normal rate, regular rhythm and normal heart sounds.  Pulmonary/Chest: Effort normal and breath sounds normal.  Abdominal: Soft. Bowel sounds are normal.  Neurological: He is alert and oriented to person, place, and time.    Foot exam -monofilament normal BP (!) 156/98 (BP Location: Right Arm, Patient Position: Sitting, Cuff Size: Normal)   Pulse 87   Temp 98.1 F (36.7 C) (Oral)   Ht 6\' 3"  (1.905 m)   Wt 265 lb 6.4 oz (120.4 kg)   SpO2 97%   BMI 33.17 kg/m  Wt Readings from Last 3 Encounters:  12/27/18 265 lb 6.4 oz (120.4 kg)  01/10/18 268 lb 6.4 oz (121.7 kg)  07/26/17 264 lb (119.7 kg)    Health Maintenance Due  Topic Date Due  . OPHTHALMOLOGY EXAM  12/11/1994  . HIV Screening  12/11/1999  . FOOT EXAM  12/03/2017  . HEMOGLOBIN A1C  07/13/2018     Lab Results  Component Value Date   TSH 0.686 07/08/2017   Lab Results  Component Value Date   WBC 8.2 07/08/2017   HGB 14.1 07/08/2017   HCT 43.5 07/08/2017   MCV 82 07/08/2017   PLT 227 07/08/2017   Lab Results  Component Value Date   NA 136 01/10/2018   K 4.7 01/10/2018   CO2 22 01/10/2018   GLUCOSE 308 (H) 01/10/2018   BUN 13 01/10/2018   CREATININE 0.85 01/10/2018   BILITOT 0.3 01/10/2018   ALKPHOS 68 01/10/2018   AST 12 01/10/2018   ALT 12 01/10/2018   PROT 8.2 01/10/2018   ALBUMIN 4.6 01/10/2018   CALCIUM 9.9 01/10/2018   Lab Results  Component Value Date   CHOL 253 (H) 01/10/2018   Lab Results  Component Value Date   HDL 32 (L) 01/10/2018   Lab Results  Component Value Date   LDLCALC 188 (H) 01/10/2018   Lab Results  Component Value Date   TRIG 167 (H) 01/10/2018   Lab Results  Component Value Date   CHOLHDL 7.9 (H) 01/10/2018   Lab Results  Component Value Date   HGBA1C 12.5 (A) 01/10/2018       Assessment & Plan:   1. Uncontrolled type 2 diabetes mellitus with hyperglycemia (HCC) Re-establish treatment - Ambulatory referral to Ophthalmology - HM DIABETES FOOT EXAM - Hemoglobin A1c - POCT glucose (manual entry) - Lipid Panel - CBC - POCT Microscopic Urinalysis ([facility]) - dapagliflozin propanediol (FARXIGA) 5 MG TABS tablet; Take 5 mg by mouth daily.  Dispense: 30 tablet; Refill: 1 -  Comprehensive metabolic panel Risk/benefit/side effects d/w pt with farxiga re-start. Pt unable to tolerate metformin. Pt resistant to starting insulin Monitor glucose at home 2. Bilateral sciatica Pt noted dizziness with back pain-decrease dose to 5-use half - cyclobenzaprine (FLEXERIL) 5 MG tablet; Take 2 tablets (10 mg total) by mouth at bedtime.  Dispense: 30 tablet; Refill: 5  3. Essential hypertension Lisinopril-risk/benefit/side effects d/w pt - Comprehensive metabolic panel  4. Dyslipidemia Restart medication for cholesterol-risk/benefit/side effects d/w pt - atorvastatin (LIPITOR) 40 MG tablet; Take 1 tablet (40 mg total) by mouth daily.  Dispense: 90 tablet; Refill: 0 LISA Hannah Beat, MD

## 2018-12-28 LAB — LIPID PANEL
Chol/HDL Ratio: 6.8 ratio — ABNORMAL HIGH (ref 0.0–5.0)
Cholesterol, Total: 250 mg/dL — ABNORMAL HIGH (ref 100–199)
HDL: 37 mg/dL — ABNORMAL LOW (ref >39)
LDL Calculated: 180 mg/dL — ABNORMAL HIGH (ref 0–99)
Triglycerides: 166 mg/dL — ABNORMAL HIGH (ref 0–149)
VLDL Cholesterol Cal: 33 mg/dL (ref 5–40)

## 2018-12-28 LAB — COMPREHENSIVE METABOLIC PANEL
ALT: 15 IU/L (ref 0–44)
AST: 9 IU/L (ref 0–40)
Albumin/Globulin Ratio: 1.5 (ref 1.2–2.2)
Albumin: 4.8 g/dL (ref 4.0–5.0)
Alkaline Phosphatase: 75 IU/L (ref 39–117)
BUN/Creatinine Ratio: 15 (ref 9–20)
BUN: 13 mg/dL (ref 6–20)
Bilirubin Total: 0.3 mg/dL (ref 0.0–1.2)
CO2: 22 mmol/L (ref 20–29)
Calcium: 9.8 mg/dL (ref 8.7–10.2)
Chloride: 98 mmol/L (ref 96–106)
Creatinine, Ser: 0.85 mg/dL (ref 0.76–1.27)
GFR calc Af Amer: 131 mL/min/{1.73_m2} (ref >59)
GFR calc non Af Amer: 114 mL/min/{1.73_m2} (ref >59)
Globulin, Total: 3.3 g/dL (ref 1.5–4.5)
Glucose: 350 mg/dL — ABNORMAL HIGH (ref 65–99)
Potassium: 4.5 mmol/L (ref 3.5–5.2)
Sodium: 137 mmol/L (ref 134–144)
Total Protein: 8.1 g/dL (ref 6.0–8.5)

## 2018-12-28 LAB — CBC
Hematocrit: 44.7 % (ref 37.5–51.0)
Hemoglobin: 14.1 g/dL (ref 13.0–17.7)
MCH: 26.2 pg — ABNORMAL LOW (ref 26.6–33.0)
MCHC: 31.5 g/dL (ref 31.5–35.7)
MCV: 83 fL (ref 79–97)
Platelets: 220 10*3/uL (ref 150–450)
RBC: 5.38 x10E6/uL (ref 4.14–5.80)
RDW: 11.8 % (ref 11.6–15.4)
WBC: 7 10*3/uL (ref 3.4–10.8)

## 2018-12-29 DIAGNOSIS — E1165 Type 2 diabetes mellitus with hyperglycemia: Secondary | ICD-10-CM | POA: Insufficient documentation

## 2018-12-29 DIAGNOSIS — M5432 Sciatica, left side: Secondary | ICD-10-CM | POA: Insufficient documentation

## 2018-12-29 DIAGNOSIS — M5431 Sciatica, right side: Secondary | ICD-10-CM | POA: Insufficient documentation

## 2019-01-04 IMAGING — DX DG LUMBAR SPINE 2-3V
3 series · 3 of 3 positions shown · non-contrast
Comparison: 03/14/2015

CLINICAL DATA: Chronic low back pain at L1-2

EXAM:
LUMBAR SPINE - 2-3 VIEW

[l-spine ap]
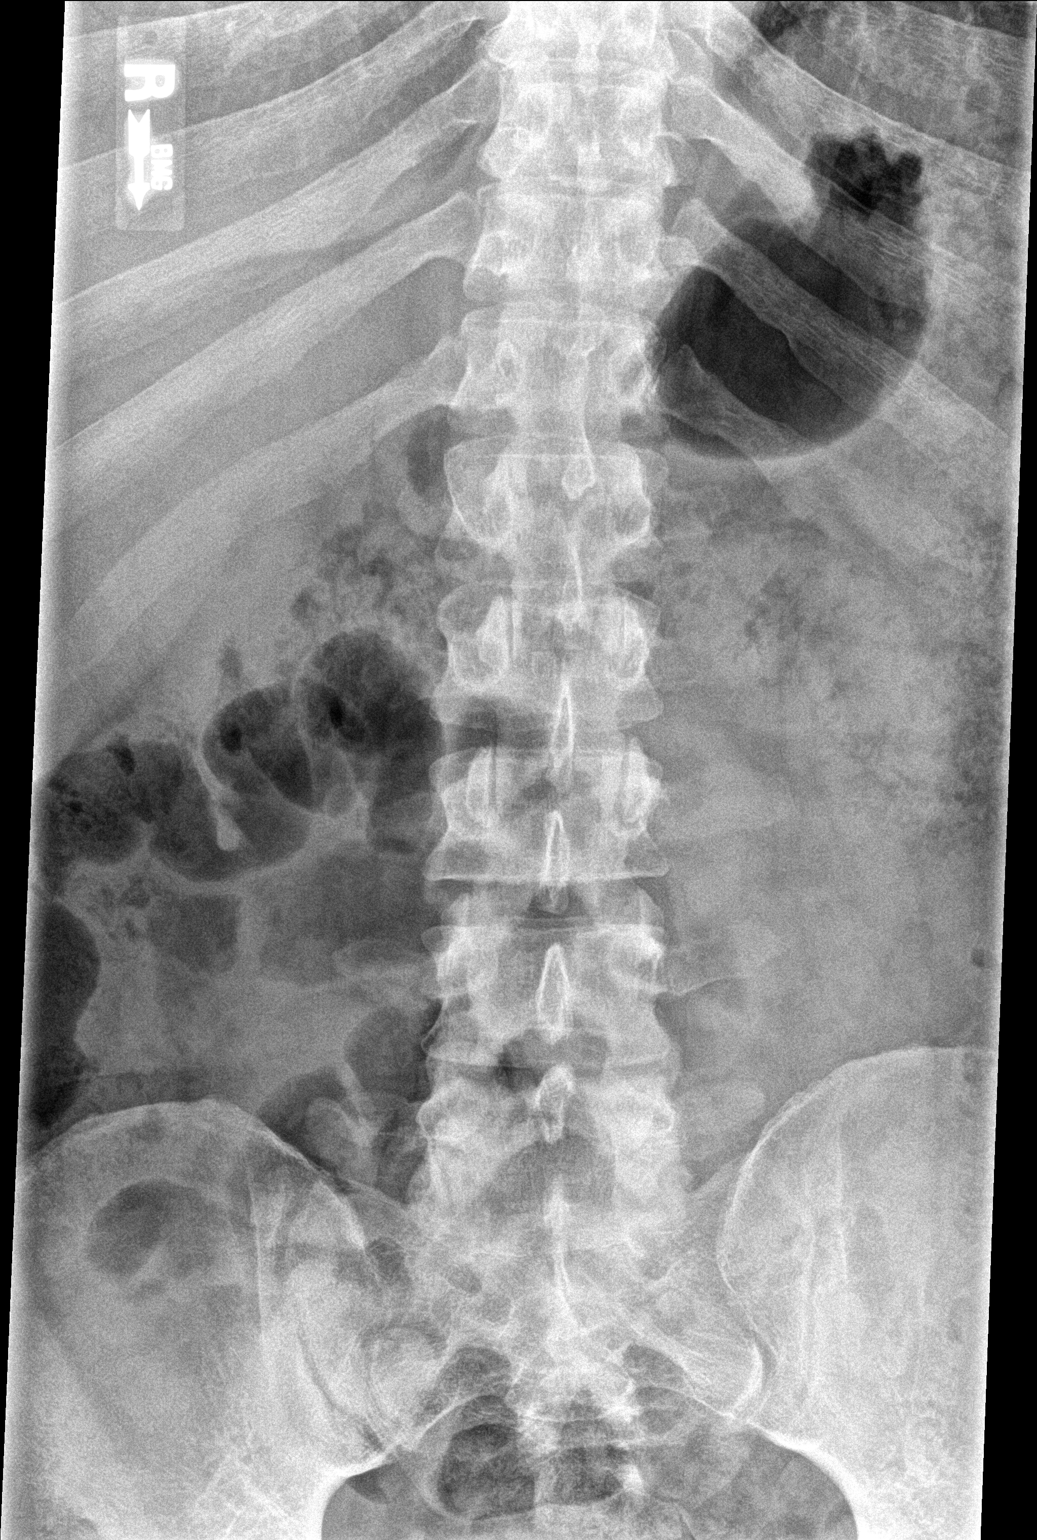

[l-spine lat]
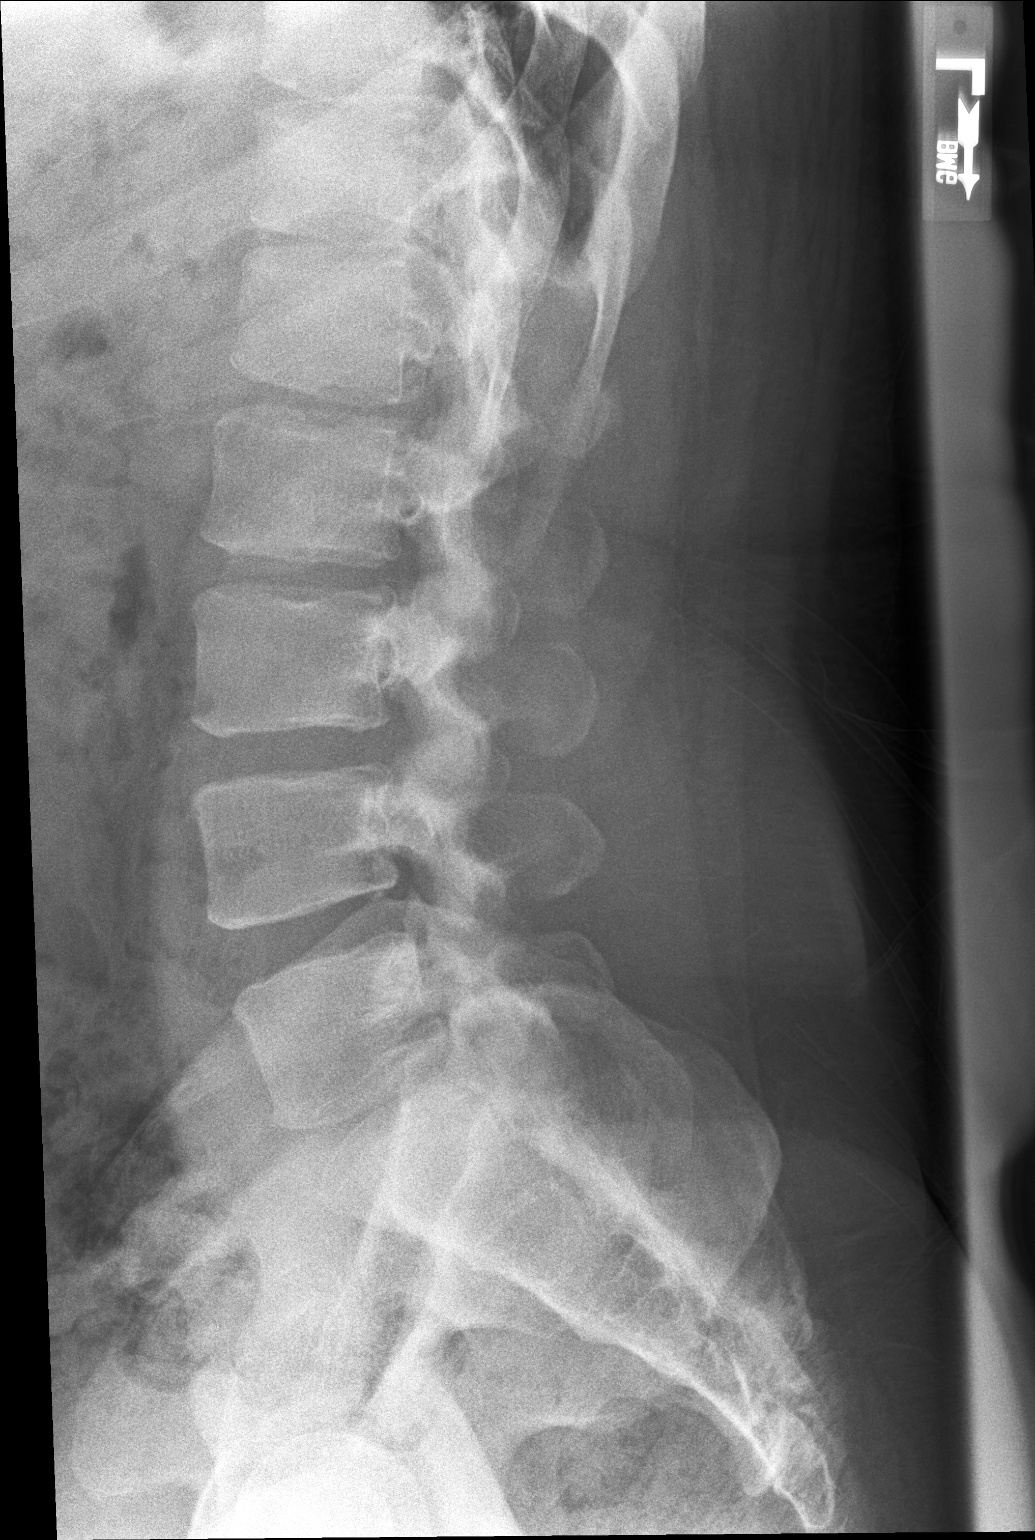

[l-spine l5-s1]
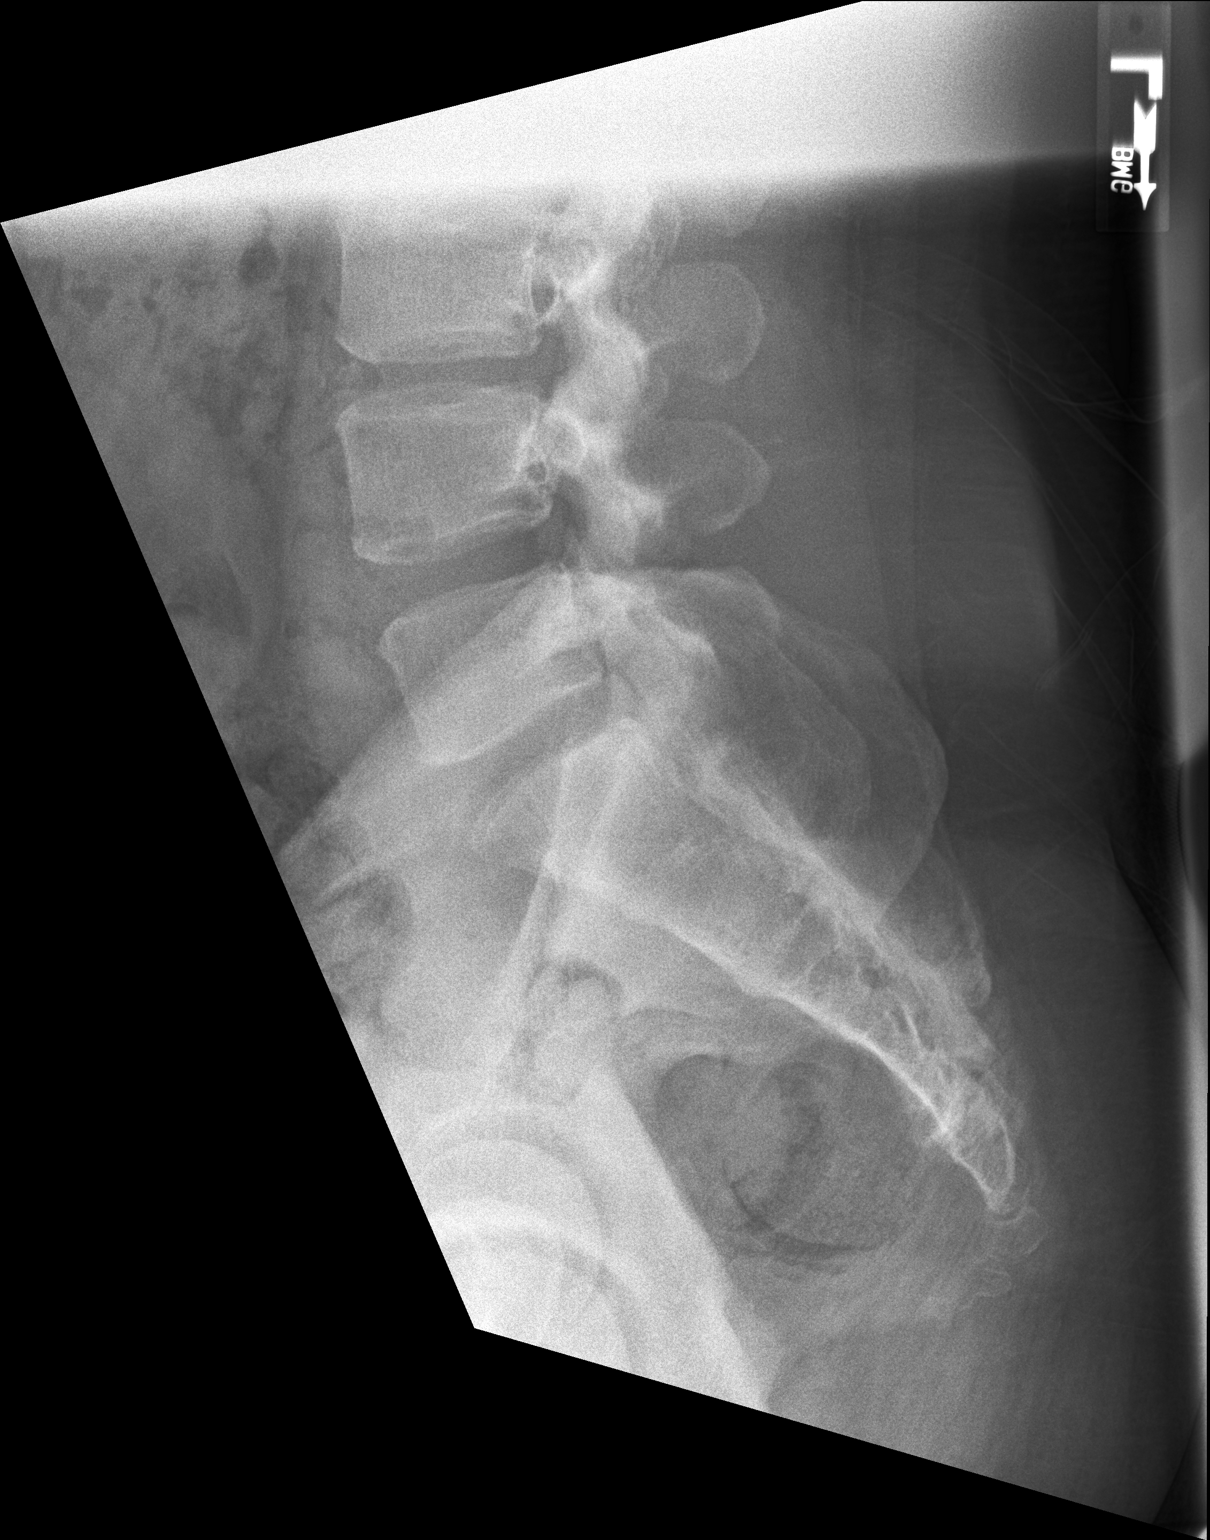

[3 of 3 positions shown; findings below may reference images not displayed]

FINDINGS: Five lumbar type vertebral bodies.

Negative for disc narrowing or visible facet spurring. Normal
alignment.
IMPRESSION: Negative lumbar spine series.

## 2019-01-13 ENCOUNTER — Encounter: Payer: Self-pay | Admitting: Family Medicine

## 2019-01-13 ENCOUNTER — Ambulatory Visit (INDEPENDENT_AMBULATORY_CARE_PROVIDER_SITE_OTHER): Payer: BC Managed Care – PPO | Admitting: Family Medicine

## 2019-01-13 ENCOUNTER — Other Ambulatory Visit: Payer: Self-pay

## 2019-01-13 VITALS — BP 130/82 | HR 72 | Temp 97.8°F | Resp 17 | Ht 75.0 in | Wt 270.2 lb

## 2019-01-13 DIAGNOSIS — E1165 Type 2 diabetes mellitus with hyperglycemia: Secondary | ICD-10-CM | POA: Diagnosis not present

## 2019-01-13 DIAGNOSIS — Z713 Dietary counseling and surveillance: Secondary | ICD-10-CM

## 2019-01-13 DIAGNOSIS — I10 Essential (primary) hypertension: Secondary | ICD-10-CM | POA: Diagnosis not present

## 2019-01-13 LAB — POCT URINALYSIS DIP (MANUAL ENTRY)
Bilirubin, UA: NEGATIVE
Blood, UA: NEGATIVE
Glucose, UA: 500 mg/dL — AB
Ketones, POC UA: NEGATIVE mg/dL
Leukocytes, UA: NEGATIVE
Nitrite, UA: NEGATIVE
Protein Ur, POC: NEGATIVE mg/dL
Spec Grav, UA: >=1.03 — AB (ref 1.010–1.025)
Urobilinogen, UA: 0.2 E.U./dL
pH, UA: 6.5 (ref 5.0–8.0)

## 2019-01-13 LAB — GLUCOSE, POCT (MANUAL RESULT ENTRY): POC Glucose: 158 mg/dl — AB (ref 70–99)

## 2019-01-13 NOTE — Patient Instructions (Addendum)
Goals Fasting the glucose should be 100-120 2 hours after a meal the glucose should be 140-180  Follow up in 6 - 8 weeks for diabetes  Please come in for a1c check in 4 weeks as a lab visit     If you have lab work done today you will be contacted with your lab results within the next 2 weeks.  If you have not heard from [place] then please contact us. The fastest way to get your results is to register for My Chart.   IF you received an x-ray today, you will receive an invoice from [facility]. Please contact [facility] at [phone] with questions or concerns regarding your invoice.   IF you received labwork today, you will receive an invoice from [place]. Please contact [facility] at [phone] with questions or concerns regarding your invoice.   Our billing staff will not be able to assist you with questions regarding bills from these companies.  You will be contacted with the lab results as soon as they are available. The fastest way to get your results is to activate your My Chart account. Instructions are located on the last page of this paperwork. If you have not heard from [place] regarding the results in 2 weeks, please contact this office.

## 2019-01-13 NOTE — Progress Notes (Signed)
Established Patient Office Visit  Subjective:  Patient ID: Drew Hernandez, male    DOB: Oct 18, 1985  Age: 33 y.o. MRN: [id number]  CC:  Chief Complaint  Patient presents with  . blood pressure and diabetes 2 week recheck  . Medication Refill    atorvastatin, cyclobenzaprine, dapagliflozin propanediol and lisinopril    HPI Drew Hernandez presents for   Diabetes Mellitus: Patient presents for follow up of diabetes. Symptoms: hyperglycemia. Symptoms have stabilized. Patient denies foot ulcerations, hypoglycemia , increase appetite, nausea, paresthesia of the feet, polydipsia, polyuria and visual disturbances.  Evaluation to date has been included: fasting blood sugar and hemoglobin A1C. He is on [place] 5mg . His min was 139 mg/dL, his max was 409349 mg/dL and his average was 811.$[id number]$224.5mg /dL   Lab Results  Component Value Date   HGBA1C 12.2 (H) 12/27/2018   Wt Readings from Last 3 Encounters:  01/13/19 270 lb 3.2 oz (122.6 kg)  12/27/18 265 lb 6.4 oz (120.4 kg)  01/10/18 268 lb 6.4 oz (121.7 kg)   Nutrition He works second shift and he tries to avoid bagged chips He might do apple sauce, fruits, pretzels, popcorn He is cutting back on the fruit before going to sleep He is drinking water and not having sodas of juice. He is eating spinach, broccoli and carrots   Hypertension: Patient here for follow-up of elevated blood pressure. He is exercising and is adherent to low salt diet.  Blood pressure is well controlled at home now that he resumed his medications. Cardiac symptoms none. Patient denies chest pain, chest pressure/discomfort, dyspnea, exertional chest pressure/discomfort, irregular heart beat and lower extremity edema.  Cardiovascular risk factors: diabetes mellitus, hypertension and obesity (BMI >= 30 kg/m2). Use of agents associated with hypertension: none. History of target organ damage: none. BP Readings from Last 3 Encounters:  01/13/19 130/82  12/27/18 (!) 156/98  01/10/18  136/80     Past Medical History:  Diagnosis Date  . Diabetes mellitus without complication (HCC)    dx with DM 2 age 33, he was on insulin in the past and lost weight, was over 400 lbs.   . Hypertension    age 33 due to obesity    No past surgical history on file.  Family History  Problem Relation Age of Onset  . Cancer Paternal Grandfather   . Hypertension Paternal Grandfather     Social History   Socioeconomic History  . Marital status: Single    Spouse name: Not on file  . Number of children: Not on file  . Years of education: Not on file  . Highest education level: Not on file  Occupational History  . Not on file  Social Needs  . Financial resource strain: Not on file  . Food insecurity    Worry: Not on file    Inability: Not on file  . Transportation needs    Medical: Not on file    Non-medical: Not on file  Tobacco Use  . Smoking status: Never Smoker  . Smokeless tobacco: Never Used  Substance and Sexual Activity  . Alcohol use: Yes    Alcohol/week: 0.0 standard drinks  . Drug use: No  . Sexual activity: Not on file  Lifestyle  . Physical activity    Days per week: Not on file    Minutes per session: Not on file  . Stress: Not on file  Relationships  . Social Musicianconnections    Talks on phone: Not on file    Gets  together: Not on file    Attends religious service: Not on file    Active member of club or organization: Not on file    Attends meetings of clubs or organizations: Not on file    Relationship status: Not on file  . Intimate partner violence    Fear of current or ex partner: Not on file    Emotionally abused: Not on file    Physically abused: Not on file    Forced sexual activity: Not on file  Other Topics Concern  . Not on file  Social History Narrative  . Not on file    Outpatient Medications Prior to Visit  Medication Sig Dispense Refill  . atorvastatin (LIPITOR) 40 MG tablet Take 1 tablet (40 mg total) by mouth daily. 90 tablet 0   . cyclobenzaprine (FLEXERIL) 5 MG tablet Take 2 tablets (10 mg total) by mouth at bedtime. 30 tablet 5  . dapagliflozin propanediol (FARXIGA) 5 MG TABS tablet Take 5 mg by mouth daily. 30 tablet 1  . lisinopril (ZESTRIL) 10 MG tablet Take 1 tablet (10 mg total) by mouth daily. 90 tablet 0   No facility-administered medications prior to visit.     No Known Allergies  ROS Review of Systems Review of Systems  Constitutional: Negative for activity change, appetite change, chills and fever.  HENT: Negative for congestion, nosebleeds, trouble swallowing and voice change.   Respiratory: Negative for cough, shortness of breath and wheezing.   Gastrointestinal: Negative for diarrhea, nausea and vomiting.  Genitourinary: Negative for difficulty urinating, dysuria, flank pain and hematuria.  Musculoskeletal: Negative for back pain, joint swelling and neck pain.  Neurological: Negative for dizziness, speech difficulty, light-headedness and numbness.  See HPI. All other review of systems negative.     Objective:    Physical Exam  BP 130/82 (BP Location: Left Arm, Patient Position: Sitting, Cuff Size: Large)   Pulse 72   Temp 97.8 F (36.6 C) (Oral)   Resp 17   Ht 6\' 3"  (1.905 m)   Wt 270 lb 3.2 oz (122.6 kg)   SpO2 99%   BMI 33.77 kg/m  Wt Readings from Last 3 Encounters:  01/13/19 270 lb 3.2 oz (122.6 kg)  12/27/18 265 lb 6.4 oz (120.4 kg)  01/10/18 268 lb 6.4 oz (121.7 kg)   Physical Exam  Constitutional: Oriented to person, place, and time. Appears well-developed and well-nourished.  HENT:  Head: Normocephalic and atraumatic.  Eyes: Conjunctivae and EOM are normal.  Cardiovascular: Normal rate, regular rhythm, normal heart sounds and intact distal pulses.  No murmur heard. Pulmonary/Chest: Effort normal and breath sounds normal. No stridor. No respiratory distress. Has no wheezes.  Neurological: Is alert and oriented to person, place, and time.  Skin: Skin is warm. Capillary  refill takes less than 2 seconds.  Psychiatric: Has a normal mood and affect. Behavior is normal. Judgment and thought content normal.    Health Maintenance Due  Topic Date Due  . OPHTHALMOLOGY EXAM  12/11/1994  . HIV Screening  12/11/1999  . INFLUENZA VACCINE  12/31/2018    There are no preventive care reminders to display for this patient.  Lab Results  Component Value Date   TSH 0.686 07/08/2017   Lab Results  Component Value Date   WBC 7.0 12/27/2018   HGB 14.1 12/27/2018   HCT 44.7 12/27/2018   MCV 83 12/27/2018   PLT 220 12/27/2018   Lab Results  Component Value Date   NA 137 12/27/2018  K 4.5 12/27/2018   CO2 22 12/27/2018   GLUCOSE 350 (H) 12/27/2018   BUN 13 12/27/2018   CREATININE 0.85 12/27/2018   BILITOT 0.3 12/27/2018   ALKPHOS 75 12/27/2018   AST 9 12/27/2018   ALT 15 12/27/2018   PROT 8.1 12/27/2018   ALBUMIN 4.8 12/27/2018   CALCIUM 9.8 12/27/2018   Lab Results  Component Value Date   CHOL 250 (H) 12/27/2018   Lab Results  Component Value Date   HDL 37 (L) 12/27/2018   Lab Results  Component Value Date   LDLCALC 180 (H) 12/27/2018   Lab Results  Component Value Date   TRIG 166 (H) 12/27/2018   Lab Results  Component Value Date   CHOLHDL 6.8 (H) 12/27/2018   Lab Results  Component Value Date   HGBA1C 12.2 (H) 12/27/2018      Assessment & Plan:   Problem List Items Addressed This Visit      Cardiovascular and Mediastinum   Essential hypertension - continue lisinopril bp improved with compliance      Endocrine   Uncontrolled type 2 diabetes mellitus with hyperglycemia (HCC) - Primary Improving home blood glucose Too soon to check a1c Discussed what his range should be  Discussed nutritional modification and discussed maintaining hydration as diabetics can get severely dehydrated easily Discussed any barriers to meds  And at this time there are none Historically his biggest barrier is med compliance and follow up for  appointments Will try a different approach: pt can come in for labs in between office visits as a way to track his progress and keep a closer monitoring without patient having to make as many office visits   Relevant Orders   POCT glucose (manual entry) (Completed)   POCT urinalysis dipstick (Completed)   Hemoglobin A1c    Other Visit Diagnoses    Encounter for nutritional counseling    -  Discussed current strategies Discussed glycemic index when making choices       No orders of the defined types were placed in this encounter.   Follow-up: No follow-ups on file.    Doristine BosworthZoe A Tywaun Hiltner, MD

## 2019-02-24 ENCOUNTER — Other Ambulatory Visit: Payer: Self-pay

## 2019-02-24 ENCOUNTER — Encounter: Payer: Self-pay | Admitting: Family Medicine

## 2019-02-24 ENCOUNTER — Ambulatory Visit (INDEPENDENT_AMBULATORY_CARE_PROVIDER_SITE_OTHER): Payer: BC Managed Care – PPO | Admitting: Family Medicine

## 2019-02-24 VITALS — BP 134/88 | HR 76 | Temp 98.3°F | Ht 75.0 in | Wt 272.0 lb

## 2019-02-24 DIAGNOSIS — Z23 Encounter for immunization: Secondary | ICD-10-CM

## 2019-02-24 DIAGNOSIS — E785 Hyperlipidemia, unspecified: Secondary | ICD-10-CM

## 2019-02-24 DIAGNOSIS — E1165 Type 2 diabetes mellitus with hyperglycemia: Secondary | ICD-10-CM | POA: Diagnosis not present

## 2019-02-24 DIAGNOSIS — I10 Essential (primary) hypertension: Secondary | ICD-10-CM | POA: Diagnosis not present

## 2019-02-24 LAB — HEMOGLOBIN A1C
Est. average glucose Bld gHb Est-mCnc: 212 mg/dL
Hgb A1c MFr Bld: 9 % — ABNORMAL HIGH (ref 4.8–5.6)

## 2019-02-24 LAB — CMP14+EGFR
ALT: 13 IU/L (ref 0–44)
AST: 17 IU/L (ref 0–40)
Albumin/Globulin Ratio: 1.7 (ref 1.2–2.2)
Albumin: 4.8 g/dL (ref 4.0–5.0)
Alkaline Phosphatase: 66 IU/L (ref 39–117)
BUN/Creatinine Ratio: 13 (ref 9–20)
BUN: 11 mg/dL (ref 6–20)
Bilirubin Total: 0.5 mg/dL (ref 0.0–1.2)
CO2: 21 mmol/L (ref 20–29)
Calcium: 9.7 mg/dL (ref 8.7–10.2)
Chloride: 101 mmol/L (ref 96–106)
Creatinine, Ser: 0.86 mg/dL (ref 0.76–1.27)
GFR calc Af Amer: 131 mL/min/{1.73_m2} (ref >59)
GFR calc non Af Amer: 113 mL/min/{1.73_m2} (ref >59)
Globulin, Total: 2.9 g/dL (ref 1.5–4.5)
Glucose: 179 mg/dL — ABNORMAL HIGH (ref 65–99)
Potassium: 3.9 mmol/L (ref 3.5–5.2)
Sodium: 139 mmol/L (ref 134–144)
Total Protein: 7.7 g/dL (ref 6.0–8.5)

## 2019-02-24 LAB — LIPID PANEL
Chol/HDL Ratio: 5 ratio (ref 0.0–5.0)
Cholesterol, Total: 145 mg/dL (ref 100–199)
HDL: 29 mg/dL — ABNORMAL LOW (ref >39)
LDL Chol Calc (NIH): 97 mg/dL (ref 0–99)
Triglycerides: 99 mg/dL (ref 0–149)
VLDL Cholesterol Cal: 19 mg/dL (ref 5–40)

## 2019-02-24 MED ORDER — ATORVASTATIN CALCIUM 40 MG PO TABS: 40.0000 mg | ORAL_TABLET | Freq: Every day | ORAL | 3 refills | Status: DC

## 2019-02-24 MED ORDER — LISINOPRIL 10 MG PO TABS: 10.0000 mg | ORAL_TABLET | Freq: Every day | ORAL | 1 refills | Status: DC

## 2019-02-24 MED ORDER — DAPAGLIFLOZIN PROPANEDIOL 10 MG PO TABS: 10.0000 mg | ORAL_TABLET | Freq: Every day | ORAL | 0 refills | Status: DC

## 2019-02-24 NOTE — Patient Instructions (Signed)
° ° ° °  If you have lab work done today you will be contacted with your lab results within the next 2 weeks.  If you have not heard from us then please contact us. The fastest way to get your results is to register for My Chart. ° ° °IF you received an x-ray today, you will receive an invoice from [facility]. Please contact  Radiology at [phone] with questions or concerns regarding your invoice.  ° °IF you received labwork today, you will receive an invoice from [facility]. Please contact [facility] at [phone] with questions or concerns regarding your invoice.  ° °Our billing staff will not be able to assist you with questions regarding bills from these companies. ° °You will be contacted with the lab results as soon as they are available. The fastest way to get your results is to activate your My Chart account. Instructions are located on the last page of this paperwork. If you have not heard from us regarding the results in 2 weeks, please contact this office. °  ° ° ° °

## 2019-02-24 NOTE — Progress Notes (Signed)
Established Patient Office Visit  Subjective:  Patient ID: Drew Hernandez, male    DOB: 06-02-1985  Age: 33 y.o. MRN: [id number]  CC:  Chief Complaint  Patient presents with  . medical conditions    8 week f/u   . Diabetes    HPI Barney Tabbert presents for   Diabetes Mellitus: Patient presents for follow up of diabetes. Symptoms: hyperglycemia. Symptoms have gradually improved. Patient denies hypoglycemia , nausea, paresthesia of the feet, polyuria and visual disturbances.  Evaluation to date has been included: hemoglobin A1C.  Home sugars: BGs have been labile ranging between 160 and 200. Treatment to date: FARXIGA 5MG AND A DIABETIC DIET.  Pt reports that he would like to increase his dose of farxiga. He denies dysuria, itching from his penis or diarrhea. He drinks plenty water.   Wt Readings from Last 3 Encounters:  02/24/19 272 lb (123.4 kg)  01/13/19 270 lb 3.2 oz (122.6 kg)  12/27/18 265 lb 6.4 oz (120.4 kg)   Hypertension: Patient here for follow-up of elevated blood pressure. He is exercising and is adherent to low salt diet.  Blood pressure is well controlled at home. Cardiac symptoms none. Patient denies chest pain, chest pressure/discomfort, claudication, fatigue, irregular heart beat, near-syncope and palpitations.  Cardiovascular risk factors: diabetes mellitus, dyslipidemia and hypertension. Use of agents associated with hypertension: none. History of target organ damage: none. BP Readings from Last 3 Encounters:  02/24/19 134/88  01/13/19 130/82  12/27/18 (!) 156/98    Hyperlipidemia: Patient presents with hyperlipidemia.  He is eating more seafood and cut out red meat. He is taking his lipitor without side effects. He denies any myalgias or skin changed.   Lab Results  Component Value Date   CHOL 250 (H) 12/27/2018   CHOL 253 (H) 01/10/2018   CHOL 219 (H) 07/08/2017   Lab Results  Component Value Date   HDL 37 (L) 12/27/2018   HDL 32 (L) 01/10/2018   HDL  34 (L) 07/08/2017   Lab Results  Component Value Date   LDLCALC 180 (H) 12/27/2018   LDLCALC 188 (H) 01/10/2018   LDLCALC 140 (H) 07/08/2017   Lab Results  Component Value Date   TRIG 166 (H) 12/27/2018   TRIG 167 (H) 01/10/2018   TRIG 223 (H) 07/08/2017   Lab Results  Component Value Date   CHOLHDL 6.8 (H) 12/27/2018   CHOLHDL 7.9 (H) 01/10/2018   CHOLHDL 6.4 (H) 07/08/2017   No results found for: LDLDIRECT    Past Medical History:  Diagnosis Date  . Diabetes mellitus without complication (HCC)    dx with DM 2 age 39, he was on insulin in the past and lost weight, was over 400 lbs.   . Hypertension    age 70 due to obesity    No past surgical history on file.  Family History  Problem Relation Age of Onset  . Cancer Paternal Grandfather   . Hypertension Paternal Grandfather     Social History   Socioeconomic History  . Marital status: Single    Spouse name: Not on file  . Number of children: Not on file  . Years of education: Not on file  . Highest education level: Not on file  Occupational History  . Not on file  Social Needs  . Financial resource strain: Not on file  . Food insecurity    Worry: Not on file    Inability: Not on file  . Transportation needs    Medical: Not  on file    Non-medical: Not on file  Tobacco Use  . Smoking status: Never Smoker  . Smokeless tobacco: Never Used  Substance and Sexual Activity  . Alcohol use: Yes    Alcohol/week: 0.0 standard drinks  . Drug use: No  . Sexual activity: Not on file  Lifestyle  . Physical activity    Days per week: Not on file    Minutes per session: Not on file  . Stress: Not on file  Relationships  . Social Herbalist on phone: Not on file    Gets together: Not on file    Attends religious service: Not on file    Active member of club or organization: Not on file    Attends meetings of clubs or organizations: Not on file    Relationship status: Not on file  . Intimate partner  violence    Fear of current or ex partner: Not on file    Emotionally abused: Not on file    Physically abused: Not on file    Forced sexual activity: Not on file  Other Topics Concern  . Not on file  Social History Narrative  . Not on file    Outpatient Medications Prior to Visit  Medication Sig Dispense Refill  . cyclobenzaprine (FLEXERIL) 5 MG tablet Take 2 tablets (10 mg total) by mouth at bedtime. 30 tablet 5  . atorvastatin (LIPITOR) 40 MG tablet Take 1 tablet (40 mg total) by mouth daily. 90 tablet 0  . dapagliflozin propanediol (FARXIGA) 5 MG TABS tablet Take 5 mg by mouth daily. 30 tablet 1  . lisinopril (ZESTRIL) 10 MG tablet Take 1 tablet (10 mg total) by mouth daily. 90 tablet 0   No facility-administered medications prior to visit.     No Known Allergies  ROS Review of Systems Review of Systems  Constitutional: Negative for activity change, appetite change, chills and fever.  HENT: Negative for congestion, nosebleeds, trouble swallowing and voice change.   Respiratory: Negative for cough, shortness of breath and wheezing.   Gastrointestinal: Negative for diarrhea, nausea and vomiting.  Genitourinary: Negative for difficulty urinating, dysuria, flank pain and hematuria.  Musculoskeletal: Negative for back pain, joint swelling and neck pain.  Neurological: Negative for dizziness, speech difficulty, light-headedness and numbness.  See HPI. All other review of systems negative.     Objective:    Physical Exam  BP 134/88   Pulse 76   Temp 98.3 F (36.8 C) (Oral)   Ht _0  (1.905 m)   Wt 272 lb (123.4 kg)   SpO2 98%   BMI 34.00 kg/m  Wt Readings from Last 3 Encounters:  02/24/19 272 lb (123.4 kg)  01/13/19 270 lb 3.2 oz (122.6 kg)  12/27/18 265 lb 6.4 oz (120.4 kg)   Physical Exam  Constitutional: Oriented to person, place, and time. Appears well-developed and well-nourished.  HENT:  Head: Normocephalic and atraumatic.  Eyes: Conjunctivae and EOM are  normal.  Neck: neck supple, no thyromegaly Cardiovascular: Normal rate, regular rhythm, normal heart sounds and intact distal pulses.  No murmur heard. Pulmonary/Chest: Effort normal and breath sounds normal. No stridor. No respiratory distress. Has no wheezes.  Neurological: Is alert and oriented to person, place, and time.  Skin: Skin is warm. Capillary refill takes less than 2 seconds.  Psychiatric: Has a normal mood and affect. Behavior is normal. Judgment and thought content normal.    Health Maintenance Due  Topic Date Due  . OPHTHALMOLOGY  EXAM  12/11/1994  . HIV Screening  12/11/1999    There are no preventive care reminders to display for this patient.  Lab Results  Component Value Date   TSH 0.686 07/08/2017   Lab Results  Component Value Date   WBC 7.0 12/27/2018   HGB 14.1 12/27/2018   HCT 44.7 12/27/2018   MCV 83 12/27/2018   PLT 220 12/27/2018   Lab Results  Component Value Date   NA 137 12/27/2018   K 4.5 12/27/2018   CO2 22 12/27/2018   GLUCOSE 350 (H) 12/27/2018   BUN 13 12/27/2018   CREATININE 0.85 12/27/2018   BILITOT 0.3 12/27/2018   ALKPHOS 75 12/27/2018   AST 9 12/27/2018   ALT 15 12/27/2018   PROT 8.1 12/27/2018   ALBUMIN 4.8 12/27/2018   CALCIUM 9.8 12/27/2018   Lab Results  Component Value Date   CHOL 250 (H) 12/27/2018   Lab Results  Component Value Date   HDL 37 (L) 12/27/2018   Lab Results  Component Value Date   LDLCALC 180 (H) 12/27/2018   Lab Results  Component Value Date   TRIG 166 (H) 12/27/2018   Lab Results  Component Value Date   CHOLHDL 6.8 (H) 12/27/2018   Lab Results  Component Value Date   HGBA1C 12.2 (H) 12/27/2018      Assessment & Plan:   Problem List Items Addressed This Visit      Cardiovascular and Mediastinum   Essential hypertension - Patient's blood pressure is at goal of 139/89 or less. Condition is stable. Continue current medications and treatment plan. I recommend that you exercise for  30-45 minutes 5 days a week. I also recommend a balanced diet with fruits and vegetables every day, lean meats, and little fried foods. The DASH diet (you can find this online) is a good example of this.    Relevant Medications   lisinopril (ZESTRIL) 10 MG tablet   atorvastatin (LIPITOR) 40 MG tablet     Endocrine   Uncontrolled type 2 diabetes mellitus with hyperglycemia (HCC) Will increase farxiga to 68m   Relevant Medications   dapagliflozin propanediol (FARXIGA) 10 MG TABS tablet   lisinopril (ZESTRIL) 10 MG tablet   atorvastatin (LIPITOR) 40 MG tablet   Other Relevant Orders   Lipid panel   CMP14+EGFR     Other   Dyslipidemia - Primary- discussed diet and plant based diet to correct lipids Anticipate good improvement based on his diet He is a nonsmoker   Relevant Medications   atorvastatin (LIPITOR) 40 MG tablet   Other Relevant Orders   Lipid panel   CMP14+EGFR    Other Visit Diagnoses    Need for prophylactic vaccination and inoculation against influenza       Relevant Orders   Flu Vaccine QUAD 36+ mos IM (Completed)      Meds ordered this encounter  Medications  . dapagliflozin propanediol (FARXIGA) 10 MG TABS tablet    Sig: Take 10 mg by mouth daily before breakfast.    Dispense:  90 tablet    Refill:  0  . lisinopril (ZESTRIL) 10 MG tablet    Sig: Take 1 tablet (10 mg total) by mouth daily.    Dispense:  90 tablet    Refill:  1  . atorvastatin (LIPITOR) 40 MG tablet    Sig: Take 1 tablet (40 mg total) by mouth daily.    Dispense:  90 tablet    Refill:  3    Follow-up: No  follow-ups on file.    Forrest Moron, MD

## 2019-05-23 ENCOUNTER — Other Ambulatory Visit: Payer: Self-pay

## 2019-05-23 ENCOUNTER — Ambulatory Visit (INDEPENDENT_AMBULATORY_CARE_PROVIDER_SITE_OTHER): Payer: BLUE CROSS/BLUE SHIELD | Admitting: Family Medicine

## 2019-05-23 ENCOUNTER — Encounter: Payer: Self-pay | Admitting: Family Medicine

## 2019-05-23 VITALS — BP 134/80 | HR 85 | Temp 98.5°F | Resp 17 | Ht 75.0 in | Wt 264.0 lb

## 2019-05-23 DIAGNOSIS — E785 Hyperlipidemia, unspecified: Secondary | ICD-10-CM | POA: Diagnosis not present

## 2019-05-23 DIAGNOSIS — M5431 Sciatica, right side: Secondary | ICD-10-CM

## 2019-05-23 DIAGNOSIS — E1165 Type 2 diabetes mellitus with hyperglycemia: Secondary | ICD-10-CM

## 2019-05-23 DIAGNOSIS — M6289 Other specified disorders of muscle: Secondary | ICD-10-CM

## 2019-05-23 DIAGNOSIS — Z114 Encounter for screening for human immunodeficiency virus [HIV]: Secondary | ICD-10-CM

## 2019-05-23 DIAGNOSIS — I10 Essential (primary) hypertension: Secondary | ICD-10-CM | POA: Diagnosis not present

## 2019-05-23 DIAGNOSIS — M5432 Sciatica, left side: Secondary | ICD-10-CM

## 2019-05-23 LAB — POCT GLYCOSYLATED HEMOGLOBIN (HGB A1C): Hemoglobin A1C: 8.5 % — AB (ref 4.0–5.6)

## 2019-05-23 MED ORDER — GLIMEPIRIDE 1 MG PO TABS: 1.0000 mg | ORAL_TABLET | Freq: Every day | ORAL | 1 refills | Status: DC

## 2019-05-23 MED ORDER — ATORVASTATIN CALCIUM 40 MG PO TABS: 40.0000 mg | ORAL_TABLET | Freq: Every day | ORAL | 3 refills | Status: AC

## 2019-05-23 MED ORDER — CYCLOBENZAPRINE HCL 5 MG PO TABS: 10.0000 mg | ORAL_TABLET | Freq: Every day | ORAL | 5 refills | Status: DC

## 2019-05-23 MED ORDER — DAPAGLIFLOZIN PROPANEDIOL 10 MG PO TABS: 10.0000 mg | ORAL_TABLET | Freq: Every day | ORAL | 0 refills | Status: DC

## 2019-05-23 MED ORDER — LISINOPRIL 10 MG PO TABS: 10.0000 mg | ORAL_TABLET | Freq: Every day | ORAL | 1 refills | Status: AC

## 2019-05-23 NOTE — Progress Notes (Signed)
Established Patient Office Visit  Subjective:  Patient ID: Drew LyonsRicky Hernandez, male    DOB: [birth year]  Age: 33 y.o. MRN: [id number]  CC:  Chief Complaint  Patient presents with  . Medical Management of Chronic Issues    3 months  . Medication Refill    lipitor, flexeril, farxiga and zesteril    HPI Saket Korff presents for   Diabetes He is compliant with farxiga  He is on lipitor ans zesteril He works second shift and eats dinner at 4pm He eats a snack when he gets off work at [employer]12am, Chartered loss adjusterpretzels or popcorn. He might do a double shift and not eat He states that in the mornings his sugars are high 100s He states that before he gets off work his sugars are lower at around 136.   He is exercising by lifting weights at home. He states that he is feeling better but concerned that his sugars are never less than 100.    Wt Readings from Last 3 Encounters:  05/23/19 264 lb (119.7 kg)  02/24/19 272 lb (123.4 kg)  01/13/19 270 lb 3.2 oz (122.6 kg)   Hypertension: Patient here for follow-up of elevated blood pressure. He is exercising and is adherent to low salt diet.  Blood pressure is well controlled at home. Cardiac symptoms none. Patient denies chest pain, chest pressure/discomfort and claudication.  Cardiovascular risk factors: diabetes mellitus, dyslipidemia, hypertension and male gender. Use of agents associated with hypertension: none. History of target organ damage: none. BP Readings from Last 3 Encounters:  05/23/19 134/80  02/24/19 134/88  01/13/19 130/82   Left sciatic pain and hamstring tightness Pt states that he has daily hamstring pain He reports that he has been having pain causing limping Worse on the left side   Past Medical History:  Diagnosis Date  . Diabetes mellitus without complication (HCC)    dx with DM 2 age 33, he was on insulin in the past and lost weight, was over 400 lbs.   . Hypertension    age 33 due to obesity    No past surgical history on  file.  Family History  Problem Relation Age of Onset  . Cancer Paternal Grandfather   . Hypertension Paternal Grandfather     Social History   Socioeconomic History  . Marital status: Single    Spouse name: Not on file  . Number of children: Not on file  . Years of education: Not on file  . Highest education level: Not on file  Occupational History  . Not on file  Tobacco Use  . Smoking status: Never Smoker  . Smokeless tobacco: Never Used  Substance and Sexual Activity  . Alcohol use: Yes    Alcohol/week: 0.0 standard drinks  . Drug use: No  . Sexual activity: Not on file  Other Topics Concern  . Not on file  Social History Narrative  . Not on file   Social Determinants of Health   Financial Resource Strain:   . Difficulty of Paying Living Expenses: Not on file  Food Insecurity:   . Worried About Programme researcher, broadcasting/film/videounning Out of Food in the Last Year: Not on file  . Ran Out of Food in the Last Year: Not on file  Transportation Needs:   . Lack of Transportation (Medical): Not on file  . Lack of Transportation (Non-Medical): Not on file  Physical Activity:   . Days of Exercise per Week: Not on file  . Minutes of Exercise per Session: Not on  file  Stress:   . Feeling of Stress : Not on file  Social Connections:   . Frequency of Communication with Friends and Family: Not on file  . Frequency of Social Gatherings with Friends and Family: Not on file  . Attends Religious Services: Not on file  . Active Member of Clubs or Organizations: Not on file  . Attends Banker Meetings: Not on file  . Marital Status: Not on file  Intimate Partner Violence:   . Fear of Current or Ex-Partner: Not on file  . Emotionally Abused: Not on file  . Physically Abused: Not on file  . Sexually Abused: Not on file    Outpatient Medications Prior to Visit  Medication Sig Dispense Refill  . atorvastatin (LIPITOR) 40 MG tablet Take 1 tablet (40 mg total) by mouth daily. 90 tablet 3  .  cyclobenzaprine (FLEXERIL) 5 MG tablet Take 2 tablets (10 mg total) by mouth at bedtime. 30 tablet 5  . dapagliflozin propanediol (FARXIGA) 10 MG TABS tablet Take 10 mg by mouth daily before breakfast. 90 tablet 0  . lisinopril (ZESTRIL) 10 MG tablet Take 1 tablet (10 mg total) by mouth daily. 90 tablet 1   No facility-administered medications prior to visit.    No Known Allergies  ROS Review of Systems Review of Systems  Constitutional: Negative for activity change, appetite change, chills and fever.  HENT: Negative for congestion, nosebleeds, trouble swallowing and voice change.   Respiratory: Negative for cough, shortness of breath and wheezing.   Gastrointestinal: Negative for diarrhea, nausea and vomiting.  Genitourinary: Negative for difficulty urinating, dysuria, flank pain and hematuria.  Musculoskeletal: see hpi Neurological: Negative for dizziness, speech difficulty, light-headedness and numbness.  See HPI. All other review of systems negative.     Objective:    Physical Exam  BP 134/80 (BP Location: Right Arm, Patient Position: Sitting, Cuff Size: Large)   Pulse 85   Temp 98.5 F (36.9 C) (Oral)   Resp 17   Ht 6\' 3"  (1.905 m)   Wt 264 lb (119.7 kg)   SpO2 97%   BMI 33.00 kg/m  Wt Readings from Last 3 Encounters:  05/23/19 264 lb (119.7 kg)  02/24/19 272 lb (123.4 kg)  01/13/19 270 lb 3.2 oz (122.6 kg)   Physical Exam  Constitutional: Oriented to person, place, and time. Appears well-developed and well-nourished.  HENT:  Head: Normocephalic and atraumatic.  Eyes: Conjunctivae and EOM are normal.  Cardiovascular: Normal rate, regular rhythm, normal heart sounds and intact distal pulses.  No murmur heard. Pulmonary/Chest: Effort normal and breath sounds normal. No stridor. No respiratory distress. Has no wheezes.  Neurological: Is alert and oriented to person, place, and time.  Skin: Skin is warm. Capillary refill takes less than 2 seconds.  Psychiatric: Has  a normal mood and affect. Behavior is normal. Judgment and thought content normal.   Bilateral hamstring tightness Straight leg raise positive on the right at 15 degrees Noticeable left side limp Normal muscle mass Strength 5/5  Health Maintenance Due  Topic Date Due  . OPHTHALMOLOGY EXAM  12/11/1994  . HIV Screening  12/11/1999    There are no preventive care reminders to display for this patient.  Lab Results  Component Value Date   TSH 0.686 07/08/2017   Lab Results  Component Value Date   WBC 7.0 12/27/2018   HGB 14.1 12/27/2018   HCT 44.7 12/27/2018   MCV 83 12/27/2018   PLT 220 12/27/2018   Lab  Results  Component Value Date   NA 139 02/24/2019   K 3.9 02/24/2019   CO2 21 02/24/2019   GLUCOSE 179 (H) 02/24/2019   BUN 11 02/24/2019   CREATININE 0.86 02/24/2019   BILITOT 0.5 02/24/2019   ALKPHOS 66 02/24/2019   AST 17 02/24/2019   ALT 13 02/24/2019   PROT 7.7 02/24/2019   ALBUMIN 4.8 02/24/2019   CALCIUM 9.7 02/24/2019   Lab Results  Component Value Date   CHOL 145 02/24/2019   Lab Results  Component Value Date   HDL 29 (L) 02/24/2019   Lab Results  Component Value Date   LDLCALC 97 02/24/2019   Lab Results  Component Value Date   TRIG 99 02/24/2019   Lab Results  Component Value Date   CHOLHDL 5.0 02/24/2019   Lab Results  Component Value Date   HGBA1C 9.0 (H) 02/24/2019      Assessment & Plan:   Problem List Items Addressed This Visit      Cardiovascular and Mediastinum   Essential hypertension - Patient's blood pressure is at goal of 139/89 or less. Condition is stable. Continue current medications and treatment plan. I recommend that you exercise for 30-45 minutes 5 days a week. I also recommend a balanced diet with fruits and vegetables every day, lean meats, and little fried foods. The DASH diet (you can find this online) is a good example of this.      Endocrine   Uncontrolled type 2 diabetes mellitus with hyperglycemia ([place])  - Primary Showing improvement hemoglobin a1c goal <7% Continue exercise Lipids monitored and renal function in range On Farxiga but will add amaryl On ace inhibitor Lisinopril Reviewed diabetic foot care Emphasized importance of eye and dental exam      Relevant Orders   POCT glycosylated hemoglobin (Hb A1C)     Other   Sciatica and hamstring tightness -  Referral placed for PT    Other Visit Diagnoses             No orders of the defined types were placed in this encounter.   Follow-up: No follow-ups on file.    Forrest Moron, MD

## 2019-05-23 NOTE — Patient Instructions (Signed)
° ° ° °  If you have lab work done today you will be contacted with your lab results within the next 2 weeks.  If you have not heard from us then please contact us. The fastest way to get your results is to register for My Chart. ° ° °IF you received an x-ray today, you will receive an invoice from [facility]. Please contact [facility] at [phone] with questions or concerns regarding your invoice.  ° °IF you received labwork today, you will receive an invoice from [facility]. Please contact [facility] at [phone] with questions or concerns regarding your invoice.  ° °Our billing staff will not be able to assist you with questions regarding bills from these companies. ° °You will be contacted with the lab results as soon as they are available. The fastest way to get your results is to activate your My Chart account. Instructions are located on the last page of this paperwork. If you have not heard from us regarding the results in 2 weeks, please contact this office. °  ° ° ° °

## 2019-06-12 ENCOUNTER — Ambulatory Visit: Payer: BLUE CROSS/BLUE SHIELD | Admitting: Physical Therapy

## 2019-06-12 ENCOUNTER — Other Ambulatory Visit: Payer: Self-pay

## 2019-08-22 ENCOUNTER — Ambulatory Visit (INDEPENDENT_AMBULATORY_CARE_PROVIDER_SITE_OTHER): Payer: BLUE CROSS/BLUE SHIELD | Admitting: Family Medicine

## 2019-08-22 ENCOUNTER — Other Ambulatory Visit: Payer: Self-pay

## 2019-08-22 VITALS — BP 138/87 | HR 89 | Temp 98.0°F | Ht 74.0 in | Wt 270.8 lb

## 2019-08-22 DIAGNOSIS — E785 Hyperlipidemia, unspecified: Secondary | ICD-10-CM

## 2019-08-22 DIAGNOSIS — I10 Essential (primary) hypertension: Secondary | ICD-10-CM | POA: Diagnosis not present

## 2019-08-22 DIAGNOSIS — Z114 Encounter for screening for human immunodeficiency virus [HIV]: Secondary | ICD-10-CM

## 2019-08-22 DIAGNOSIS — Z135 Encounter for screening for eye and ear disorders: Secondary | ICD-10-CM

## 2019-08-22 DIAGNOSIS — E1165 Type 2 diabetes mellitus with hyperglycemia: Secondary | ICD-10-CM

## 2019-08-22 LAB — POCT GLYCOSYLATED HEMOGLOBIN (HGB A1C): Hemoglobin A1C: 9.6 % — AB (ref 4.0–5.6)

## 2019-08-22 MED ORDER — TRULICITY 0.75 MG/0.5ML ~~LOC~~ SOAJ: 0.7500 mg | SUBCUTANEOUS | 1 refills | Status: DC

## 2019-08-22 MED ORDER — TRULICITY 0.75 MG/0.5ML ~~LOC~~ SOAJ: 0.7500 mg | SUBCUTANEOUS | 1 refills | Status: AC

## 2019-08-22 NOTE — Patient Instructions (Addendum)
  Please mychart message with your blood glucose readings.  If you do not hear from [place] in 3 days then call and ask for Wende Neighbors my nurse.   Pick up the medication from the pharmacy.    If you have lab work done today you will be contacted with your lab results within the next 2 weeks.  If you have not heard from [place] then please contact us. The fastest way to get your results is to register for My Chart.   IF you received an x-ray today, you will receive an invoice from [facility]. Please contact [facility] at [phone] with questions or concerns regarding your invoice.   IF you received labwork today, you will receive an invoice from [facility]. Please contact [facility] at [phone] with questions or concerns regarding your invoice.   Our billing staff will not be able to assist you with questions regarding bills from these companies.  You will be contacted with the lab results as soon as they are available. The fastest way to get your results is to activate your My Chart account. Instructions are located on the last page of this paperwork. If you have not heard from [place] regarding the results in 2 weeks, please contact this office.

## 2019-08-22 NOTE — Progress Notes (Signed)
Established Patient Office Visit  Subjective:  Patient ID: Drew Hernandez, male    DOB: 02-04-1986  Age: 34 y.o. MRN: [id number]  CC:  Chief Complaint  Patient presents with  . Follow-up    x3 mos on medical conditions. Pt just has some concerns with blood suger and hw to keep it under better control. He stated that even if he does not eat when he checks it it is still high.    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 Symptoms: hyperglycemia and poor appetite. Symptoms have been well-controlled. Patient denies hypoglycemia , increase appetite, paresthesia of the feet and polydipsia.  Evaluation to date has been included: fasting blood sugar and hemoglobin A1C.  Home sugars: BGs are running  consistent with Hgb A1C.  He is eating a diabetic diet No red meat  Hypertension: Patient here for follow-up of elevated blood pressure. He is exercising and is adherent to low salt diet.  Blood pressure is well controlled at home. Cardiac symptoms none. Patient denies chest pain, chest pressure/discomfort and dyspnea.  Cardiovascular risk factors: hypertension. Use of agents associated with hypertension: none. History of target organ damage: none. BP Readings from Last 3 Encounters:  08/22/19 138/87  05/23/19 134/80  02/24/19 134/88     Past Medical History:  Diagnosis Date  . Diabetes mellitus without complication (HCC)    dx with DM 2 age 62, he was on insulin in the past and lost weight, was over 400 lbs.   . Hypertension    age 59 due to obesity    No past surgical history on file.  Family History  Problem Relation Age of Onset  . Cancer Paternal Grandfather   . Hypertension Paternal Grandfather     Social History   Socioeconomic History  . Marital status: Single    Spouse name:  Not on file  . Number of children: Not on file  . Years of education: Not on file  . Highest education level: Not on file  Occupational History  . Not on file  Tobacco Use  . Smoking status: Never Smoker  . Smokeless tobacco: Never Used  Substance and Sexual Activity  . Alcohol use: Yes    Alcohol/week: 0.0 standard drinks  . Drug use: No  . Sexual activity: Not on file  Other Topics Concern  . Not on file  Social History Narrative  . Not on file   Social Determinants of Health   Financial Resource Strain:   . Difficulty of Paying Living Expenses:   Food Insecurity:   . Worried About Programme researcher, broadcasting/film/video in the Last Year:   . Barista in the Last Year:   Transportation Needs:   . Freight forwarder (Medical):   Marland Kitchen Lack of Transportation (Non-Medical):   Physical Activity:   . Days of Exercise per Week:   . Minutes of Exercise per Session:   Stress:   . Feeling of Stress :   Social Connections:   . Frequency of Communication with Friends and Family:   . Frequency of Social Gatherings with Friends and Family:   . Attends Religious Services:   . Active Member of Clubs or Organizations:   . Attends Banker Meetings:   Marland Kitchen Marital Status:   Intimate Partner Violence:   .  Fear of Current or Ex-Partner:   . Emotionally Abused:   Marland Kitchen Physically Abused:   . Sexually Abused:     Outpatient Medications Prior to Visit  Medication Sig Dispense Refill  . atorvastatin (LIPITOR) 40 MG tablet Take 1 tablet (40 mg total) by mouth daily. 90 tablet 3  . dapagliflozin propanediol (FARXIGA) 10 MG TABS tablet Take 10 mg by mouth daily before breakfast. 90 tablet 0  . lisinopril (ZESTRIL) 10 MG tablet Take 1 tablet (10 mg total) by mouth daily. 90 tablet 1  . glimepiride (AMARYL) 1 MG tablet Take 1 tablet (1 mg total) by mouth daily with breakfast. 90 tablet 1  . cyclobenzaprine (FLEXERIL) 5 MG tablet Take 2 tablets (10 mg total) by mouth at bedtime. (Patient not  taking: Reported on 08/22/2019) 30 tablet 5   No facility-administered medications prior to visit.    No Known Allergies  ROS Review of Systems Review of Systems  Constitutional: Negative for activity change, appetite change, chills and fever.  HENT: Negative for congestion, nosebleeds, trouble swallowing and voice change.   Respiratory: Negative for cough, shortness of breath and wheezing.   Gastrointestinal: Negative for diarrhea, nausea and vomiting.  Genitourinary: Negative for difficulty urinating, dysuria, flank pain and hematuria.  Musculoskeletal: Negative for back pain, joint swelling and neck pain.  Neurological: Negative for dizziness, speech difficulty, light-headedness and numbness.  See HPI. All other review of systems negative.      Objective:    Physical Exam  BP (!) 156/93 (BP Location: Right Arm, Patient Position: Sitting, Cuff Size: Large)   Pulse 89   Temp 98 F (36.7 C) (Temporal)   Ht 6\' 2"  (1.88 m)   Wt 270 lb 12.8 oz (122.8 kg)   SpO2 97%   BMI 34.77 kg/m  Wt Readings from Last 3 Encounters:  08/22/19 270 lb 12.8 oz (122.8 kg)  05/23/19 264 lb (119.7 kg)  02/24/19 272 lb (123.4 kg)   Physical Exam  Constitutional: Oriented to person, place, and time. Appears well-developed and well-nourished.  HENT:  Head: Normocephalic and atraumatic.  Eyes: Conjunctivae and EOM are normal.  Cardiovascular: Normal rate, regular rhythm, normal heart sounds and intact distal pulses.  No murmur heard. Pulmonary/Chest: Effort normal and breath sounds normal. No stridor. No respiratory distress. Has no wheezes.  Neurological: Is alert and oriented to person, place, and time.  Skin: Skin is warm. Capillary refill takes less than 2 seconds.  Psychiatric: Has a normal mood and affect. Behavior is normal. Judgment and thought content normal.    Health Maintenance Due  Topic Date Due  . OPHTHALMOLOGY EXAM  Never done  . HIV Screening  Never done    There are no  preventive care reminders to display for this patient.  Lab Results  Component Value Date   TSH 0.686 07/08/2017   Lab Results  Component Value Date   WBC 7.0 12/27/2018   HGB 14.1 12/27/2018   HCT 44.7 12/27/2018   MCV 83 12/27/2018   PLT 220 12/27/2018   Lab Results  Component Value Date   NA 139 02/24/2019   K 3.9 02/24/2019   CO2 21 02/24/2019   GLUCOSE 179 (H) 02/24/2019   BUN 11 02/24/2019   CREATININE 0.86 02/24/2019   BILITOT 0.5 02/24/2019   ALKPHOS 66 02/24/2019   AST 17 02/24/2019   ALT 13 02/24/2019   PROT 7.7 02/24/2019   ALBUMIN 4.8 02/24/2019   CALCIUM 9.7 02/24/2019   Lab Results  Component Value  Date   CHOL 145 02/24/2019   Lab Results  Component Value Date   HDL 29 (L) 02/24/2019   Lab Results  Component Value Date   LDLCALC 97 02/24/2019   Lab Results  Component Value Date   TRIG 99 02/24/2019   Lab Results  Component Value Date   CHOLHDL 5.0 02/24/2019   Lab Results  Component Value Date   HGBA1C 9.6 (A) 08/22/2019      Assessment & Plan:   Problem List Items Addressed This Visit      Cardiovascular and Mediastinum   Essential hypertension  - Patient's blood pressure is at goal of 139/89 or less. Condition is stable. Continue current medications and treatment plan. I recommend that you exercise for 30-45 minutes 5 days a week. I also recommend a balanced diet with fruits and vegetables every day, lean meats, and little fried foods. The DASH diet (you can find this online) is a good example of this.    Relevant Orders   Lipid panel   Comprehensive metabolic panel     Endocrine   Uncontrolled type 2 diabetes mellitus with hyperglycemia (HCC)  - added Trulicity   Relevant Medications   Dulaglutide (TRULICITY) 9.16 BW/4.6KZ SOPN   Other Relevant Orders   POCT glycosylated hemoglobin (Hb A1C) (Completed)   Comprehensive metabolic panel  Pulled up trulicity website Advised pt to stop glimepiride Start trulicity 0.75mg  a week  for one month Call in sugar readings trulicity to 1.5mg  after reviewing readings Continue diabetic diet and exercise      Other   Dyslipidemia - Primary   Relevant Orders   Lipid panel    Other Visit Diagnoses    Screening for HIV (human immunodeficiency virus)       Relevant Orders   HIV antibody (with reflex)   Screening for diabetic retinopathy       Relevant Orders   Ambulatory referral to Ophthalmology          Meds ordered this encounter  Medications  . Dulaglutide (TRULICITY) 9.93 TT/0.1XB SOPN    Sig: Inject 0.75 mg into the skin once a week.    Dispense:  6 pen    Refill:  1    Follow-up: No follow-ups on file.    Forrest Moron, MD

## 2019-08-23 LAB — COMPREHENSIVE METABOLIC PANEL
ALT: 22 IU/L (ref 0–44)
AST: 19 IU/L (ref 0–40)
Albumin/Globulin Ratio: 1.6 (ref 1.2–2.2)
Albumin: 4.5 g/dL (ref 4.0–5.0)
Alkaline Phosphatase: 85 IU/L (ref 39–117)
BUN/Creatinine Ratio: 13 (ref 9–20)
BUN: 12 mg/dL (ref 6–20)
Bilirubin Total: 0.5 mg/dL (ref 0.0–1.2)
CO2: 23 mmol/L (ref 20–29)
Calcium: 9.8 mg/dL (ref 8.7–10.2)
Chloride: 102 mmol/L (ref 96–106)
Creatinine, Ser: 0.92 mg/dL (ref 0.76–1.27)
GFR calc Af Amer: 125 mL/min/{1.73_m2} (ref >59)
GFR calc non Af Amer: 108 mL/min/{1.73_m2} (ref >59)
Globulin, Total: 2.9 g/dL (ref 1.5–4.5)
Glucose: 242 mg/dL — ABNORMAL HIGH (ref 65–99)
Potassium: 4.8 mmol/L (ref 3.5–5.2)
Sodium: 138 mmol/L (ref 134–144)
Total Protein: 7.4 g/dL (ref 6.0–8.5)

## 2019-08-23 LAB — LIPID PANEL
Chol/HDL Ratio: 4.7 ratio (ref 0.0–5.0)
Cholesterol, Total: 156 mg/dL (ref 100–199)
HDL: 33 mg/dL — ABNORMAL LOW (ref >39)
LDL Chol Calc (NIH): 106 mg/dL — ABNORMAL HIGH (ref 0–99)
Triglycerides: 88 mg/dL (ref 0–149)
VLDL Cholesterol Cal: 17 mg/dL (ref 5–40)

## 2019-08-23 LAB — HIV ANTIBODY (ROUTINE TESTING W REFLEX): HIV Screen 4th Generation wRfx: NONREACTIVE

## 2019-08-25 ENCOUNTER — Encounter: Payer: Self-pay | Admitting: Family Medicine

## 2019-08-25 DIAGNOSIS — E1165 Type 2 diabetes mellitus with hyperglycemia: Secondary | ICD-10-CM

## 2019-08-25 MED ORDER — DAPAGLIFLOZIN PROPANEDIOL 10 MG PO TABS: 10.0000 mg | ORAL_TABLET | Freq: Every day | ORAL | 0 refills | Status: AC

## 2019-09-07 ENCOUNTER — Telehealth: Payer: Self-pay | Admitting: Family Medicine

## 2019-09-07 NOTE — Telephone Encounter (Signed)
Due to dr.stallings leaving our practice I called to offer a TOC appt. And to schedule a 3 month follow up included in that appointment

## 2019-09-27 ENCOUNTER — Encounter: Payer: Self-pay | Admitting: Family Medicine

## 2020-02-07 DIAGNOSIS — M5442 Lumbago with sciatica, left side: Secondary | ICD-10-CM | POA: Diagnosis not present

## 2020-02-07 DIAGNOSIS — M5416 Radiculopathy, lumbar region: Secondary | ICD-10-CM | POA: Diagnosis not present

## 2020-02-07 DIAGNOSIS — G8929 Other chronic pain: Secondary | ICD-10-CM | POA: Diagnosis not present

## 2020-11-04 ENCOUNTER — Encounter: Payer: Self-pay | Admitting: Rehabilitative and Restorative Service Providers"

## 2020-11-04 ENCOUNTER — Other Ambulatory Visit: Payer: Self-pay

## 2020-11-04 ENCOUNTER — Ambulatory Visit: Payer: PRIVATE HEALTH INSURANCE | Attending: *Deleted | Admitting: Rehabilitative and Restorative Service Providers"

## 2020-11-04 DIAGNOSIS — M25552 Pain in left hip: Secondary | ICD-10-CM | POA: Diagnosis present

## 2020-11-04 DIAGNOSIS — M6281 Muscle weakness (generalized): Secondary | ICD-10-CM | POA: Insufficient documentation

## 2020-11-04 DIAGNOSIS — M545 Low back pain, unspecified: Secondary | ICD-10-CM | POA: Diagnosis present

## 2020-11-04 DIAGNOSIS — R2689 Other abnormalities of gait and mobility: Secondary | ICD-10-CM | POA: Insufficient documentation

## 2020-11-04 DIAGNOSIS — G8929 Other chronic pain: Secondary | ICD-10-CM | POA: Diagnosis present

## 2020-11-04 DIAGNOSIS — M6289 Other specified disorders of muscle: Secondary | ICD-10-CM | POA: Diagnosis present

## 2020-11-04 DIAGNOSIS — R262 Difficulty in walking, not elsewhere classified: Secondary | ICD-10-CM | POA: Diagnosis present

## 2020-11-04 NOTE — Patient Instructions (Addendum)
Access Code: [email or web address] URL: [email or web address] Date: 11/04/2020 Prepared by: Clydie Braun Emmani Lesueur  Exercises Hooklying Hamstring Stretch with Strap - 1-2 x daily - 7 x weekly - 1 sets - 3 reps - 20 hold Supine Hamstring Stretch - 1-2 x daily - 7 x weekly - 1 sets - 3 reps - 20 hold Seated Hamstring Stretch - 1-2 x daily - 7 x weekly - 1 sets - 3 reps - 20 hold Seated Transversus Abdominis Bracing - 1 x daily - 7 x weekly - 2 sets - 10 reps ITB Stretch at Wall - 1-2 x daily - 7 x weekly - 1 sets - 3 reps Seated Figure 4 Piriformis Stretch - 1 x daily - 7 x weekly - 1 sets - 3 reps - 20 hold

## 2020-11-04 NOTE — Therapy (Signed)
[facility] [address] [place], [place], [address] Phone: [phone]   Fax:  [phone]  Physical Therapy Evaluation  Patient Details  Name: Drew Hernandez MRN: [id number] Date of Birth: 1986-05-04 Referring Provider (PT): Cameron Sprang, [place]   Encounter Date: 11/04/2020   PT End of Session - 11/04/20 1322    Visit Number 1    Number of Visits 12    Date for PT Re-Evaluation 12/27/20    Authorization Type [employer]    PT Start Time 1015    PT Stop Time 1055    PT Time Calculation (min) 40 min    Activity Tolerance Patient tolerated treatment well    Behavior During Therapy [facility] for tasks assessed/performed           Past Medical History:  Diagnosis Date  . Diabetes mellitus without complication (HCC)    dx with DM 2 age 70, he was on insulin in the past and lost weight, was over 400 lbs.   . Hypertension    age 51 due to obesity    History reviewed. No pertinent surgical history.  There were no vitals filed for this visit.    Subjective Assessment - 11/04/20 1017    Subjective Pt reports that he's had back pain and tight hamstrings for years, but reports that in 2019 it started getting worse.  He reports that in September 2021, he started having to use a cane.  He notices that once his lower back tigthens up, his hamstrings tigthen up.  Pt reports that his left side is worse than the R side.    Pertinent History HTN, DM    Limitations Walking;Standing    How long can you stand comfortably? 10 to 15 min    How long can you walk comfortably? always uncomfortable    Currently in Pain? Yes    Pain Score 4     Pain Location Hip    Pain Orientation Left;Posterior    Pain Descriptors / Indicators Cramping;Throbbing    Pain Type Chronic pain    Pain Frequency Intermittent              [facility] PT Assessment - 11/04/20 0001      Assessment   Medical Diagnosis Lumbar Radiculopathy    Referring Provider (PT)  Cameron Sprang, PA    Hand Dominance Right    Next MD Visit December 17, 2020    Prior Therapy no      Precautions   Precautions None      Restrictions   Weight Bearing Restrictions No      Balance Screen   Has the patient fallen in the past 6 months Yes    How many times? 1    Has the patient had a decrease in activity level because of a fear of falling?  No    Is the patient reluctant to leave their home because of a fear of falling?  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  On 90/90 HS stretch is missing 50 degrees from 0      Strength   Overall Strength Deficits    Overall Strength Comments RLE of 4/5 grossly throughout, LLE 3/5 grossly throughout      Transfers   Five time sit to stand comments  24.8 sec      Ambulation/Gait   Ambulation/Gait Yes    Ambulation/Gait Assistance 5: Supervision                      Objective measurements completed on examination: See above findings.       [facility] Adult PT Treatment/Exercise - 11/04/20 0001      Exercises   Exercises Knee/Hip      Knee/Hip Exercises: Stretches   Active Hamstring Stretch Both;2 reps;20 seconds    Active Hamstring Stretch Limitations 90/90 HS stretch    ITB Stretch Both;2 reps;20 seconds    ITB Stretch Limitations in standing    Piriformis Stretch  Both;2 reps;20 seconds    Piriformis Stretch Limitations Seated figure 4                  PT Education - 11/04/20 1321    Education Details Pt issued HEP    Person(s) Educated Patient    Methods Explanation;Demonstration;Handout    Comprehension Verbalized understanding;Returned demonstration            PT Short Term Goals - 11/04/20 1332      PT SHORT TERM GOAL #1   Title Pt will be indepedent with initial HEP.    Time 2    Period Weeks    Status New             PT Long Term Goals - 11/04/20 1333      PT LONG TERM GOAL #1   Title Pt will be independent with advanced HEP.    Time 8    Period Weeks    Status New      PT LONG TERM GOAL #2   Title Pt will have BLE strength of at least 5 minus/5 to allow him to play with his daughter.    Time 8    Period Weeks    Status New      PT LONG TERM GOAL #3   Title Pt will increase BERG to at least 53/56 to place him at a low risk of falling.    Time 8    Period Weeks    Status New      PT LONG TERM GOAL #4   Title Patient will be able to ambulate greater than 30 minutes without increase in pain to allow him to be able to go to park/zoo with his daughter.    Time 8    Period Weeks                  Plan - 11/04/20 1325    Clinical Impression Statement Patient presents with a referral from Cameron Sprang, PA with Dr Sharolyn Douglas secondary to lumbar radiculopathy.  He presents with LE weakness and hamstring tightness, with L side more impaired than R side.  He has been ambulating with a cane since approximately September and has had one fall in the past 6 months, with multiple losses of balance corrected prior to a fall occuring.  He reports that when he starts to feel his low back cramping, that he begins to have radiating pain down his legs, L greater than R.  He would benefit from skilled PT to address his functional impairments to allow him to return to walking without a cane and to be able ot take his  daughter to the zoo again.    Personal Factors and Comorbidities Comorbidity 2    Comorbidities HTN, DM    Examination-Activity Limitations Locomotion Level;Stairs;Stand    Examination-Participation Restrictions Occupation;Yard Work;Community Activity    Stability/Clinical Decision Making Evolving/Moderate complexity    Clinical Decision Making Moderate    Rehab Potential Good    PT Frequency 2x / week    PT Duration 8 weeks    PT Treatment/Interventions ADLs/Self Care Home Management;Electrical Stimulation;Cryotherapy;Iontophoresis 4mg /ml Dexamethasone;Hernandez Heat;Traction;Ultrasound;Gait training;Stair training;Functional mobility training;Therapeutic activities;Therapeutic exercise;Balance training;Neuromuscular re-education;Patient/family education;Manual techniques;Passive range of motion;Dry needling    PT Next Visit Plan assess HEP, strengthening, stretching    PT Home Exercise Plan Access Code: [email or web address]    Consulted and Agree with Plan of Care Patient           Patient will benefit from skilled therapeutic intervention in order to improve the following deficits and impairments:  Decreased balance,Increased muscle spasms,Difficulty walking,Decreased activity tolerance,Decreased strength,Impaired flexibility,Postural dysfunction,Pain  Visit Diagnosis: Muscle weakness (generalized) - Plan: PT plan of care cert/re-cert  Chronic left-sided low back pain without sciatica - Plan: PT plan of care cert/re-cert  Balance problem - Plan: PT plan of care cert/re-cert  Pain in left hip - Plan: PT plan of care cert/re-cert  Hamstring tightness - Plan: PT plan of care cert/re-cert  Difficulty in walking, not elsewhere classified - Plan: PT plan of care cert/re-cert     Problem List Patient Active Problem List   Diagnosis Date Noted  . Bilateral sciatica 12/29/2018  . Uncontrolled type 2 diabetes mellitus with hyperglycemia (HCC) 12/29/2018  . Class 2 obesity due to excess calories  with serious comorbidity and body mass index (BMI) of 35.0 to 35.9 in adult 12/03/2016  . Dyslipidemia 12/03/2016  . Spasm of back muscles 03/14/2015  . Essential hypertension 01/26/2015  . Type 2 diabetes mellitus without complication (HCC) 01/26/2015    01/28/2015, PT, DPT 11/04/2020, 1:39 PM  [facility] [address] [place], [place], [place] Phone: [phone]   Fax:  [phone]  Name: Drew Hernandez MRN: [name] Date of Birth: Feb 21, 1986

## 2020-11-11 ENCOUNTER — Ambulatory Visit: Payer: PRIVATE HEALTH INSURANCE | Admitting: Physical Therapy

## 2020-11-15 ENCOUNTER — Ambulatory Visit: Payer: PRIVATE HEALTH INSURANCE | Admitting: Physical Therapy

## 2020-11-18 ENCOUNTER — Ambulatory Visit: Payer: PRIVATE HEALTH INSURANCE | Admitting: Rehabilitative and Restorative Service Providers"

## 2020-11-20 ENCOUNTER — Ambulatory Visit: Payer: PRIVATE HEALTH INSURANCE | Admitting: Rehabilitative and Restorative Service Providers"

## 2020-11-25 ENCOUNTER — Ambulatory Visit: Payer: PRIVATE HEALTH INSURANCE | Admitting: Rehabilitative and Restorative Service Providers"

## 2020-11-27 ENCOUNTER — Ambulatory Visit: Payer: PRIVATE HEALTH INSURANCE | Admitting: Rehabilitative and Restorative Service Providers"
